# Patient Record
Sex: Female | Born: 1985 | Hispanic: No | Marital: Single | Smoking: Current every day smoker
Health system: Southern US, Community
[De-identification: ages and names within clinical notes are randomized; demographics above are authoritative.]

## PROBLEM LIST (undated history)

## (undated) ENCOUNTER — Inpatient Hospital Stay (HOSPITAL_COMMUNITY): Payer: Self-pay

## (undated) DIAGNOSIS — N390 Urinary tract infection, site not specified: Secondary | ICD-10-CM

## (undated) DIAGNOSIS — A599 Trichomoniasis, unspecified: Secondary | ICD-10-CM

## (undated) HISTORY — PX: INDUCED ABORTION: SHX677

---
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
## 2004-05-08 ENCOUNTER — Ambulatory Visit: Payer: Self-pay | Admitting: Obstetrics and Gynecology

## 2004-05-08 ENCOUNTER — Inpatient Hospital Stay (HOSPITAL_COMMUNITY): Admission: AD | Admit: 2004-05-08 | Discharge: 2004-05-11 | Payer: Self-pay | Admitting: *Deleted

## 2004-05-09 ENCOUNTER — Ambulatory Visit: Payer: Self-pay | Admitting: *Deleted

## 2004-12-17 ENCOUNTER — Emergency Department (HOSPITAL_COMMUNITY): Admission: EM | Admit: 2004-12-17 | Discharge: 2004-12-17 | Payer: Self-pay | Admitting: Family Medicine

## 2007-03-05 ENCOUNTER — Emergency Department (HOSPITAL_COMMUNITY): Admission: EM | Admit: 2007-03-05 | Discharge: 2007-03-05 | Payer: Self-pay | Admitting: Emergency Medicine

## 2007-11-13 ENCOUNTER — Ambulatory Visit: Payer: Self-pay | Admitting: Obstetrics and Gynecology

## 2007-11-13 ENCOUNTER — Inpatient Hospital Stay: Admission: RE | Admit: 2007-11-13 | Discharge: 2007-11-13 | Payer: Self-pay | Admitting: Obstetrics & Gynecology

## 2007-11-15 ENCOUNTER — Ambulatory Visit: Payer: Self-pay | Admitting: Advanced Practice Midwife

## 2007-11-15 ENCOUNTER — Inpatient Hospital Stay (HOSPITAL_COMMUNITY): Admission: AD | Admit: 2007-11-15 | Discharge: 2007-11-17 | Payer: Self-pay | Admitting: Obstetrics & Gynecology

## 2010-05-22 NOTE — H&P (Signed)
   [name], [name] NO.:  [id number]   MEDICAL RECORD NO.:  [id number]                   PATIENT TYPE:  INP   LOCATION:  9327                                 FACILITY:  [facility]   PHYSICIAN:  Kathreen Cosier, M.D.           DATE OF BIRTH:  19-Oct-1985   DATE OF ADMISSION:  11/04/2001  DATE OF DISCHARGE:                                HISTORY & PHYSICAL   HISTORY OF PRESENT ILLNESS:  The patient is an 25 year old gravida 2, para 0-  0-1-0, [name] November 25, 2001.  She had limited prenatal care.  Saw me twice  in the office.  She was admitted in early labor.  The cervix was 100-200%,  vertex -1.  She came in with a lot of bleeding, contracting every two  minutes.  The uterus was nice and soft in between contractions.  GBS was  unknown.  She was thought to be in early labor and admitted.  The patient  desired an epidural and after she received the epidural, her bleeding had  ceased.  On examination, she was 1-2 cm, 100% , vertex -1.  There was a  large amount of bright red blood present.  The uterus is still nice and soft  in between contractions.  It was decided that she was having a possible  early abruption and should be delivered by a C-section.   PHYSICAL EXAMINATION:  GENERAL:  Well-developed female in labor.  HEENT:  Negative.  Normal sclerae.  HEART:  Regular rhythm.  No murmurs, no gallops.  ABDOMEN:  Term-sized uterus.  EXTREMITIES:  Negative.   LABORATORY DATA:  On admission, her hemoglobin was 11.6, hematocrit 33,  platelets 254, urine negative, glucose 124.  Hepatitis negative.  Rubella-  immune.  Antibody screen negative.  She was O positive.                                               Kathreen Cosier, M.D.    BAM/MEDQ  D:  11/05/2001  T:  11/05/2001  Job:  [id number]

## 2010-05-22 NOTE — Op Note (Signed)
   NAMESEERAT, PEADEN NO.:  [id number]   MEDICAL RECORD NO.:  [id number]                   PATIENT TYPE:  INP   LOCATION:  9327                                 [facility]   PHYSICIAN:  Kathreen Cosier, M.D.           DATE OF BIRTH:  Feb 17, 1985   DATE OF PROCEDURE:  11/04/2001  DATE OF DISCHARGE:                                 OPERATIVE REPORT   PREOPERATIVE DIAGNOSIS:  Possible abruption of the placenta at 37 weeks.   POSTOPERATIVE DIAGNOSES:  Possible abruption of the placenta at 37 weeks.   PROCEDURE:   SURGEON:  Kathreen Cosier, M.D.   ANESTHESIA:  Epidural.   DESCRIPTION OF PROCEDURE:  The patient was placed on the operating table in  the supine position.  Abdomen prepped and draped.  Bladder emptied with a  Foley catheter.  Transverse suprapubic incision is made and carried down to  the fascia.  The fascia was cleaned and incised the length of the incision.  Recti muscles were retracted laterally.  Peritoneum incised longitudinally.  A transverse incision was then made in the visceral peritoneum above the  bladder.  The bladder was mobilized inferiorly.  A transverse lower incision  was made.  The fluid was clear.  The patient was delivered from the OP  position of a female, Apgars 6 and 8, weighing 5 pounds, 7 ounces.  There was  a moderate amount of free blood in the peritoneal cavity.  The placenta was  anterior and removed manually, and sent to pathology.  The uterine cavity  was cleaned with dry laps.  The uterine incision was closed in one layer  with continuous suture of #1 chromic.  Hemostasis was satisfactory.  The  bladder flap was reattached with 2-0 chromic.  The uterus was well  contracted.  Tubes and ovaries were normal.  The abdomen closed in layers,  peritoneum continuous suture of 0 chromic, fascia continuous with 2-0 Dexon,  and the skin closed with subcuticular stitch of 3-0 plain.  A pH was also  done at the  time of delivery.  The blood loss was 700 cc.                                               Kathreen Cosier, M.D.    BAM/MEDQ  D:  11/05/2001  T:  11/05/2001  Job:  [id number]

## 2010-05-22 NOTE — Discharge Summary (Signed)
   NAMEORETHA, WEISMANN NO.:  [id number]   MEDICAL RECORD NO.:  [id number]                   PATIENT TYPE:  INP   LOCATION:  9108                                 [facility]   PHYSICIAN:  Kathreen Cosier, M.D.           DATE OF BIRTH:  [birth year]   DATE OF ADMISSION:  11/04/2001  DATE OF DISCHARGE:                                 DISCHARGE SUMMARY   HOSPITAL COURSE:  The patient is an 25 year old gravida 2, para 0-0-1-0 [facility]  November 25, 2001 who was admitted in early labor, cervix 1 cm, 100%, vertex  minus 1, who came in because of bleeding.  She was contracting every two  minutes with uterus relaxed in between contractions and the contractions  being mild.  After initial exam, bleeding ceased.  The patient was  uncomfortable, got an epidural and the bleeding was noted to be heavy.  It  was decided this patient was having early abruption.  Was taken to the  operating room and had a female, Apgars of 6 and 8 from the OP position, with  a lot of free blood in the peritoneal cavity.  The placenta was sent to  pathology.  Cord pH was 7.35.  Blood loss was 700 cc.  The patient did well  postoperatively.  She had a female, Apgar of 5 and 8, weighing 5 pounds 7  ounces.  Postop, her hemoglobin was 8.4.  She did well and was discharged  home on the third postoperative day.   DISCHARGE DIAGNOSIS:  Status post abruption of placenta at 37 weeks.                                               Kathreen Cosier, M.D.    BAM/MEDQ  D:  11/07/2001  T:  11/07/2001  Job:  [id number]

## 2010-10-06 LAB — HEMOGLOBINOPATHY EVALUATION
Hgb A2 Quant: 2.4
Hgb A: 97.6 %
Hgb S Quant: 0 % (ref 0.0–0.0)

## 2010-10-06 LAB — DIFFERENTIAL
Eosinophils Absolute: 0.1
Eosinophils Relative: 1
Lymphs Abs: 1.6
Monocytes Absolute: 0.6
Monocytes Relative: 7

## 2010-10-06 LAB — URINALYSIS, ROUTINE W REFLEX MICROSCOPIC
Bilirubin Urine: NEGATIVE
Glucose, UA: NEGATIVE
Ketones, ur: NEGATIVE
Nitrite: NEGATIVE
Protein, ur: NEGATIVE
Specific Gravity, Urine: 1.015
Urobilinogen, UA: 0.2
pH: 6.5

## 2010-10-06 LAB — LUPUS ANTICOAGULANT PANEL: Lupus Anticoagulant: NOT DETECTED

## 2010-10-06 LAB — STREP B DNA PROBE

## 2010-10-06 LAB — SICKLE CELL SCREEN: Sickle Cell Screen: NEGATIVE

## 2010-10-06 LAB — RPR: RPR Ser Ql: NONREACTIVE

## 2010-10-06 LAB — URINE MICROSCOPIC-ADD ON

## 2010-10-06 LAB — GLUCOSE TOLERANCE, 1 HOUR: Glucose, 1 Hour GTT: 82

## 2010-10-06 LAB — URINE CULTURE

## 2010-10-06 LAB — TYPE AND SCREEN
ABO/RH(D): O POS
Antibody Screen: NEGATIVE

## 2010-10-06 LAB — CBC
HCT: 32.2 — ABNORMAL LOW
Hemoglobin: 10.8 — ABNORMAL LOW
MCV: 93.5
Platelets: 304
RBC: 3.53 — ABNORMAL LOW
RDW: 15.6 — ABNORMAL HIGH
WBC: 8.8

## 2010-10-06 LAB — CARDIOLIPIN ANTIBODIES, IGM+IGG
Anticardiolipin IgG: <7 — ABNORMAL LOW (ref <11)
Anticardiolipin IgM: 10 — ABNORMAL LOW (ref <10)

## 2010-10-06 LAB — ABO/RH: ABO/RH(D): O POS

## 2010-10-06 LAB — RAPID URINE DRUG SCREEN, HOSP PERFORMED: Cocaine: NOT DETECTED

## 2010-10-06 LAB — RUBELLA SCREEN: Rubella: 99.3 — ABNORMAL HIGH

## 2011-01-05 NOTE — L&D Delivery Note (Signed)
I was present for delivery and repair and agree with above. Napoleon Form, MD

## 2011-01-05 NOTE — L&D Delivery Note (Signed)
Delivery Note At 10:28 AM a viable and healthy female was delivered via VBAC, Spontaneous (Presentation: Right Occiput Anterior).  APGAR: 9, 9; weight pending.   Placenta status: in tact, .  Cord: 3 vessels with the following complications: None.    Anesthesia: Epidural  Episiotomy: None Lacerations: None Suture Repair: n/a Est. Blood Loss (mL): 300  Mom to postpartum.  Baby to nursery-stable.  Simone Curia 09/20/2011, 11:21 AM

## 2011-04-22 ENCOUNTER — Emergency Department (HOSPITAL_COMMUNITY)
Admission: EM | Admit: 2011-04-22 | Discharge: 2011-04-22 | Disposition: A | Discharge status: Home / Self Care | Payer: Self-pay | Attending: Emergency Medicine | Admitting: Emergency Medicine

## 2011-04-22 ENCOUNTER — Other Ambulatory Visit (HOSPITAL_COMMUNITY): Payer: Self-pay

## 2011-04-22 ENCOUNTER — Emergency Department (HOSPITAL_COMMUNITY): Payer: Self-pay

## 2011-04-22 ENCOUNTER — Encounter (HOSPITAL_COMMUNITY): Payer: Self-pay | Admitting: *Deleted

## 2011-04-22 DIAGNOSIS — A599 Trichomoniasis, unspecified: Secondary | ICD-10-CM

## 2011-04-22 DIAGNOSIS — O98819 Other maternal infectious and parasitic diseases complicating pregnancy, unspecified trimester: Secondary | ICD-10-CM | POA: Insufficient documentation

## 2011-04-22 DIAGNOSIS — N390 Urinary tract infection, site not specified: Secondary | ICD-10-CM | POA: Insufficient documentation

## 2011-04-22 DIAGNOSIS — O2390 Unspecified genitourinary tract infection in pregnancy, unspecified trimester: Secondary | ICD-10-CM | POA: Insufficient documentation

## 2011-04-22 DIAGNOSIS — A5909 Other urogenital trichomoniasis: Secondary | ICD-10-CM | POA: Insufficient documentation

## 2011-04-22 DIAGNOSIS — R109 Unspecified abdominal pain: Secondary | ICD-10-CM | POA: Insufficient documentation

## 2011-04-22 DIAGNOSIS — Z331 Pregnant state, incidental: Secondary | ICD-10-CM

## 2011-04-22 DIAGNOSIS — N72 Inflammatory disease of cervix uteri: Secondary | ICD-10-CM

## 2011-04-22 LAB — CBC
MCV: 90.4 fL (ref 78.0–100.0)
Platelets: 247 10*3/uL (ref 150–400)
RBC: 3.65 MIL/uL — ABNORMAL LOW (ref 3.87–5.11)
WBC: 9 10*3/uL (ref 4.0–10.5)

## 2011-04-22 LAB — URINE MICROSCOPIC-ADD ON

## 2011-04-22 LAB — URINALYSIS, ROUTINE W REFLEX MICROSCOPIC
Glucose, UA: NEGATIVE mg/dL
Specific Gravity, Urine: 1.029 (ref 1.005–1.030)
pH: 5.5 (ref 5.0–8.0)

## 2011-04-22 LAB — DIFFERENTIAL
Lymphocytes Relative: 21 % (ref 12–46)
Lymphs Abs: 1.9 10*3/uL (ref 0.7–4.0)
Neutro Abs: 6.4 10*3/uL (ref 1.7–7.7)
Neutrophils Relative %: 71 % (ref 43–77)

## 2011-04-22 LAB — POCT I-STAT, CHEM 8
BUN: 7 mg/dL (ref 6–23)
Calcium, Ion: 1.24 mmol/L (ref 1.12–1.32)
Chloride: 106 mEq/L (ref 96–112)
Glucose, Bld: 88 mg/dL (ref 70–99)
Potassium: 3.5 mEq/L (ref 3.5–5.1)

## 2011-04-22 LAB — PREGNANCY, URINE: Preg Test, Ur: POSITIVE — AB

## 2011-04-22 LAB — POCT PREGNANCY, URINE: Preg Test, Ur: POSITIVE — AB

## 2011-04-22 MED ORDER — NITROFURANTOIN MONOHYD MACRO 100 MG PO CAPS: 100.0000 mg | ORAL_CAPSULE | Freq: Two times a day (BID) | ORAL | Status: AC

## 2011-04-22 MED ORDER — AZITHROMYCIN 250 MG PO TABS
1000.0000 mg | ORAL_TABLET | Freq: Once | ORAL | Status: AC
  Administered 2011-04-22: 1000 mg via ORAL
  Filled 2011-04-22: qty 4

## 2011-04-22 MED ORDER — CEFTRIAXONE SODIUM 250 MG IJ SOLR
250.0000 mg | Freq: Once | INTRAMUSCULAR | Status: AC
  Administered 2011-04-22: 250 mg via INTRAMUSCULAR
  Filled 2011-04-22: qty 250

## 2011-04-22 MED ORDER — PRENATAL COMPLETE 14-0.4 MG PO TABS: 1.0000 | ORAL_TABLET | Freq: Every day | ORAL | Status: DC

## 2011-04-22 MED ORDER — LIDOCAINE HCL (PF) 1 % IJ SOLN
INTRAMUSCULAR | Status: AC
  Administered 2011-04-22: 5 mL
  Filled 2011-04-22: qty 5

## 2011-04-22 MED ORDER — METRONIDAZOLE 500 MG PO TABS: ORAL_TABLET | ORAL | Status: DC

## 2011-04-22 NOTE — Discharge Instructions (Signed)
Take macrobid as prescribed until all gone for urinary tract infection. Take prenatal multi vitamins daily. Tylenol for cramping. Your pregnancy estimated at 8w 6d. Make sure your partner treated for infection as well. Take flagyl as prescribed once you are in 2nd trimester for trichomonas, this medication is not safe right now this early in pregnancy. Follow up with your GYN doctor for further prenatal care and recheck.  ABCs of Pregnancy A Antepartum care is very important. Be sure you see your doctor and get prenatal care as soon as you think you are pregnant. At this time, you will be tested for infection, genetic abnormalities and potential problems with you and the pregnancy. This is the time to discuss diet, exercise, work, medications, labor, pain medication during labor and the possibility of a cesarean delivery. Ask any questions that may concern you. It is important to see your doctor regularly throughout your pregnancy. Avoid exposure to toxic substances and chemicals - such as cleaning solvents, lead and mercury, some insecticides, and paint. Pregnant women should avoid exposure to paint fumes, and fumes that cause you to feel ill, dizzy or faint. When possible, it is a good idea to have a pre-pregnancy consultation with your caregiver to begin some important recommendations your caregiver suggests such as, taking folic acid, exercising, quitting smoking, avoiding alcoholic beverages, etc. B Breastfeeding is the healthiest choice for both you and your baby. It has many nutritional benefits for the baby and health benefits for the mother. It also creates a very tight and loving bond between the baby and mother. Talk to your doctor, your family and friends, and your employer about how you choose to feed your baby and how they can support you in your decision. Not all birth defects can be prevented, but a woman can take actions that may increase her chance of having a healthy baby. Many birth defects  happen very early in pregnancy, sometimes before a woman even knows she is pregnant. Birth defects or abnormalities of any child in your or the father's family should be discussed with your caregiver. Get a good support bra as your breast size changes. Wear it especially when you exercise and when nursing.  C Celebrate the news of your pregnancy with the your spouse/father and family. Childbirth classes are helpful to take for you and the spouse/father because it helps to understand what happens during the pregnancy, labor and delivery. Cesarean delivery should be discussed with your doctor so you are prepared for that possibility. The pros and cons of circumcision if it is a boy, should be discussed with your pediatrician. Cigarette smoking during pregnancy can result in low birth weight babies. It has been associated with infertility, miscarriages, tubal pregnancies, infant death (mortality) and poor health (morbidity) in childhood. Additionally, cigarette smoking may cause long-term learning disabilities. If you smoke, you should try to quit before getting pregnant and not smoke during the pregnancy. Secondary smoke may also harm a mother and her developing baby. It is a good idea to ask people to stop smoking around you during your pregnancy and after the baby is born. Extra calcium is necessary when you are pregnant and is found in your prenatal vitamin, in dairy products, green leafy vegetables and in calcium supplements. D A healthy diet according to your current weight and height, along with vitamins and mineral supplements should be discussed with your caregiver. Domestic abuse or violence should be made known to your doctor right away to get the situation corrected. Drink  more water when you exercise to keep hydrated. Discomfort of your back and legs usually develops and progresses from the middle of the second trimester through to delivery of the baby. This is because of the enlarging baby and uterus,  which may also affect your balance. Do not take illegal drugs. Illegal drugs can seriously harm the baby and you. Drink extra fluids (water is best) throughout pregnancy to help your body keep up with the increases in your blood volume. Drink at least 6 to 8 glasses of water, fruit juice, or milk each day. A good way to know you are drinking enough fluid is when your urine looks almost like clear water or is very light yellow.  E Eat healthy to get the nutrients you and your unborn baby need. Your meals should include the five basic food groups. Exercise (30 minutes of light to moderate exercise a day) is important and encouraged during pregnancy, if there are no medical problems or problems with the pregnancy. Exercise that causes discomfort or dizziness should be stopped and reported to your caregiver. Emotions during pregnancy can change from being ecstatic to depression and should be understood by you, your partner and your family. F Fetal screening with ultrasound, amniocentesis and monitoring during pregnancy and labor is common and sometimes necessary. Take 400 micrograms of folic acid daily both before, when possible, and during the first few months of pregnancy to reduce the risk of birth defects of the brain and spine. All women who could possibly become pregnant should take a vitamin with folic acid, every day. It is also important to eat a healthy diet with fortified foods (enriched grain products, including cereals, rice, breads, and pastas) and foods with natural sources of folate (orange juice, green leafy vegetables, beans, peanuts, broccoli, asparagus, peas, and lentils). The father should be involved with all aspects of the pregnancy including, the prenatal care, childbirth classes, labor, delivery, and postpartum time. Fathers may also have emotional concerns about being a father, financial needs, and raising a family. G Genetic testing should be done appropriately. It is important to know  your family and the father's history. If there have been problems with pregnancies or birth defects in your family, report these to your doctor. Also, genetic counselors can talk with you about the information you might need in making decisions about having a family. You can call a major medical center in your area for help in finding a board-certified genetic counselor. Genetic testing and counseling should be done before pregnancy when possible, especially if there is a history of problems in the mother's or father's family. Certain ethnic backgrounds are more at risk for genetic defects. H Get familiar with the hospital where you will be having your baby. Get to know how long it takes to get there, the labor and delivery area, and the hospital procedures. Be sure your medical insurance is accepted there. Get your home ready for the baby including, clothes, the baby's room (when possible), furniture and car seat. Hand washing is important throughout the day, especially after handling raw meat and poultry, changing the baby's diaper or using the bathroom. This can help prevent the spread of many bacteria and viruses that cause infection. Your hair may become dry and thinner, but will return to normal a few weeks after the baby is born. Heartburn is a common problem that can be treated by taking antacids recommended by your caregiver, eating smaller meals 5 or 6 times a day, not drinking liquids  when eating, drinking between meals and raising the head of your bed 2 to 3 inches. I Insurance to cover you, the baby, doctor and hospital should be reviewed so that you will be prepared to pay any costs not covered by your insurance plan. If you do not have medical insurance, there are usually clinics and services available for you in your community. Take 30 milligrams of iron during your pregnancy as prescribed by your doctor to reduce the risk of low red blood cells (anemia) later in pregnancy. All women of  childbearing age should eat a diet rich in iron. J There should be a joint effort for the mother, father and any other children to adapt to the pregnancy financially, emotionally, and psychologically during the pregnancy. Join a support group for moms-to-be. Or, join a class on parenting or childbirth. Have the family participate when possible. K Know your limits. Let your caregiver know if you experience any of the following:   Pain of any kind.   Strong cramps.   You develop a lot of weight in a short period of time (5 pounds in 3 to 5 days).   Vaginal bleeding, leaking of amniotic fluid.   Headache, vision problems.   Dizziness, fainting, shortness of breath.   Chest pain.   Fever of 102 F (38.9 C) or higher.   Gush of clear fluid from your vagina.   Painful urination.   Domestic violence.   Irregular heartbeat (palpitations).   Rapid beating of the heart (tachycardia).   Constant feeling sick to your stomach (nauseous) and vomiting.   Trouble walking, fluid retention (edema).   Muscle weakness.   If your baby has decreased activity.   Persistent diarrhea.   Abnormal vaginal discharge.   Uterine contractions at 20-minute intervals.   Back pain that travels down your leg.  L Learn and practice that what you eat and drink should be in moderation and healthy for you and your baby. Legal drugs such as alcohol and caffeine are important issues for pregnant women. There is no safe amount of alcohol a woman can drink while pregnant. Fetal alcohol syndrome, a disorder characterized by growth retardation, facial abnormalities, and central nervous system dysfunction, is caused by a woman's use of alcohol during pregnancy. Caffeine, found in tea, coffee, soft drinks and chocolate, should also be limited. Be sure to read labels when trying to cut down on caffeine during pregnancy. More than 200 foods, beverages, and over-the-counter medications contain caffeine and have a  high salt content! There are coffees and teas that do not contain caffeine. M Medical conditions such as diabetes, epilepsy, and high blood pressure should be treated and kept under control before pregnancy when possible, but especially during pregnancy. Ask your caregiver about any medications that may need to be changed or adjusted during pregnancy. If you are currently taking any medications, ask your caregiver if it is safe to take them while you are pregnant or before getting pregnant when possible. Also, be sure to discuss any herbs or vitamins you are taking. They are medicines, too! Discuss with your doctor all medications, prescribed and over-the-counter, that you are taking. During your prenatal visit, discuss the medications your doctor may give you during labor and delivery. N Never be afraid to ask your doctor or caregiver questions about your health, the progress of the pregnancy, family problems, stressful situations, and recommendation for a pediatrician, if you do not have one. It is better to take all precautions and discuss  any questions or concerns you may have during your office visits. It is a good idea to write down your questions before you visit the doctor. O Over-the-counter cough and cold remedies may contain alcohol or other ingredients that should be avoided during pregnancy. Ask your caregiver about prescription, herbs or over-the-counter medications that you are taking or may consider taking while pregnant.  P Physical activity during pregnancy can benefit both you and your baby by lessening discomfort and fatigue, providing a sense of well-being, and increasing the likelihood of early recovery after delivery. Light to moderate exercise during pregnancy strengthens the belly (abdominal) and back muscles. This helps improve posture. Practicing yoga, walking, swimming, and cycling on a stationary bicycle are usually safe exercises for pregnant women. Avoid scuba diving, exercise  at high altitudes (over 3000 feet), skiing, horseback riding, contact sports, etc. Always check with your doctor before beginning any kind of exercise, especially during pregnancy and especially if you did not exercise before getting pregnant. Q Queasiness, stomach upset and morning sickness are common during pregnancy. Eating a couple of crackers or dry toast before getting out of bed. Foods that you normally love may make you feel sick to your stomach. You may need to substitute other nutritious foods. Eating 5 or 6 small meals a day instead of 3 large ones may make you feel better. Do not drink with your meals, drink between meals. Questions that you have should be written down and asked during your prenatal visits. R Read about and make plans to baby-proof your home. There are important tips for making your home a safer environment for your baby. Review the tips and make your home safer for you and your baby. Read food labels regarding calories, salt and fat content in the food. S Saunas, hot tubs, and steam rooms should be avoided while you are pregnant. Excessive high heat may be harmful during your pregnancy. Your caregiver will screen and examine you for sexually transmitted diseases and genetic disorders during your prenatal visits. Learn the signs of labor. Sexual relations while pregnant is safe unless there is a medical or pregnancy problem and your caregiver advises against it. T Traveling long distances should be avoided especially in the third trimester of your pregnancy. If you do have to travel out of state, be sure to take a copy of your medical records and medical insurance plan with you. You should not travel long distances without seeing your doctor first. Most airlines will not allow you to travel after 36 weeks of pregnancy. Toxoplasmosis is an infection caused by a parasite that can seriously harm an unborn baby. Avoid eating undercooked meat and handling cat litter. Be sure to wear  gloves when gardening. Tingling of the hands and fingers is not unusual and is due to fluid retention. This will go away after the baby is born. U Womb (uterus) size increases during the first trimester. Your kidneys will begin to function more efficiently. This may cause you to feel the need to urinate more often. You may also leak urine when sneezing, coughing or laughing. This is due to the growing uterus pressing against your bladder, which lies directly in front of and slightly under the uterus during the first few months of pregnancy. If you experience burning along with frequency of urination or bloody urine, be sure to tell your doctor. The size of your uterus in the third trimester may cause a problem with your balance. It is advisable to maintain good posture and  avoid wearing high heels during this time. An ultrasound of your baby may be necessary during your pregnancy and is safe for you and your baby. V Vaccinations are an important concern for pregnant women. Get needed vaccines before pregnancy. Center for Disease Control ([employer]) has clear guidelines for the use of vaccines during pregnancy. Review the list, be sure to discuss it with your doctor. Prenatal vitamins are helpful and healthy for you and the baby. Do not take extra vitamins except what is recommended. Taking too much of certain vitamins can cause overdose problems. Continuous vomiting should be reported to your caregiver. Varicose veins may appear especially if there is a family history of varicose veins. They should subside after the delivery of the baby. Support hose helps if there is leg discomfort. W Being overweight or underweight during pregnancy may cause problems. Try to get within 15 pounds of your ideal weight before pregnancy. Remember, pregnancy is not a time to be dieting! Do not stop eating or start skipping meals as your weight increases. Both you and your baby need the calories and nutrition you receive from a  healthy diet. Be sure to consult with your doctor about your diet. There is a formula and diet plan available depending on whether you are overweight or underweight. Your caregiver or nutritionist can help and advise you if necessary. X Avoid X-rays. If you must have dental work or diagnostic tests, tell your dentist or physician that you are pregnant so that extra care can be taken. X-rays should only be taken when the risks of not taking them outweigh the risk of taking them. If needed, only the minimum amount of radiation should be used. When X-rays are necessary, protective lead shields should be used to cover areas of the body that are not being X-rayed. Y Your baby loves you. Breastfeeding your baby creates a loving and very close bond between the two of you. Give your baby a healthy environment to live in while you are pregnant. Infants and children require constant care and guidance. Their health and safety should be carefully watched at all times. After the baby is born, rest or take a nap when the baby is sleeping. Z Get your ZZZs. Be sure to get plenty of rest. Resting on your side as often as possible, especially on your left side is advised. It provides the best circulation to your baby and helps reduce swelling. Try taking a nap for 30 to 45 minutes in the afternoon when possible. After the baby is born rest or take a nap when the baby is sleeping. Try elevating your feet for that amount of time when possible. It helps the circulation in your legs and helps reduce swelling.  Most information courtesy of the CDC. Document Released: 12/21/2004 Document Revised: 12/10/2010 Document Reviewed: 09/04/2008 [facility] Patient Information 2012 [place], [place].

## 2011-04-22 NOTE — ED Provider Notes (Signed)
History     CSN: [id number]  Arrival date & time 04/22/11  1348   First MD Initiated Contact with Patient 04/22/11 1359      Chief Complaint  Patient presents with  . Abdominal Pain    (Consider location/radiation/quality/duration/timing/severity/associated sxs/prior treatment) Patient is a 26 y.o. female presenting with abdominal pain. The history is provided by the patient.  Abdominal Pain The primary symptoms of the illness include abdominal pain and vaginal discharge. The primary symptoms of the illness do not include fatigue, shortness of breath, nausea, vomiting, hematemesis, dysuria or vaginal bleeding. The current episode started 3 to 5 hours ago. The onset of the illness was sudden. The problem has not changed since onset. The vaginal discharge is not associated with dysuria.   Symptoms associated with the illness do not include urgency.  pt states she may be pregnant. States last normal period was in January, does not recall the date. Pain mainly in left lower abdomen. Did not take any medications for this. Movement an palpation making it worse, staying still makes it better.  History reviewed. No pertinent past medical history.  History reviewed. No pertinent past surgical history.  History reviewed. No pertinent family history.  History  Substance Use Topics  . Smoking status: Not on file  . Smokeless tobacco: Not on file  . Alcohol Use: No    OB History    Grav Para Term Preterm Abortions TAB SAB Ect Mult Living   1               Review of Systems  Constitutional: Negative for fatigue.  HENT: Negative.   Respiratory: Negative for shortness of breath.   Cardiovascular: Negative.   Gastrointestinal: Positive for abdominal pain. Negative for nausea, vomiting, blood in stool and hematemesis.  Genitourinary: Positive for vaginal discharge. Negative for dysuria, urgency, flank pain and vaginal bleeding.  Musculoskeletal: Negative.   Skin: Negative.     Neurological: Negative.   Psychiatric/Behavioral: Negative.     Allergies  Review of patient's allergies indicates no known allergies.  Home Medications   Current Outpatient Rx  Name Route Sig Dispense Refill  . ACETAMINOPHEN 500 MG PO TABS Oral Take 500 mg by mouth once as needed. As needed for headache.      BP 104/64  Pulse 94  Temp(Src) 99 F (37.2 C) (Oral)  Resp 18  SpO2 98%  Physical Exam  Nursing note and vitals reviewed. Constitutional: She is oriented to person, place, and time. She appears well-developed and well-nourished. No distress.  HENT:  Head: Normocephalic and atraumatic.  Eyes: Conjunctivae are normal.  Neck: Neck supple.  Cardiovascular: Normal rate, regular rhythm and normal heart sounds.   Pulmonary/Chest: Effort normal and breath sounds normal. No respiratory distress. She has no wheezes.  Abdominal: Soft. Bowel sounds are normal.  Genitourinary:       Normal external genitalia. White vaginal discharge, cervix normal. CMT, left adnexal tenderness.   Musculoskeletal: Normal range of motion.  Neurological: She is alert and oriented to person, place, and time.  Skin: Skin is warm and dry.  Psychiatric: She has a normal mood and affect.    ED Course  Procedures (including critical care time)  Pt with LLQ pain, positive pregn test. Will get [place] to r/o ectopic pregnancy.   Results for orders placed during the hospital encounter of 04/22/11  CBC      Component Value Range   WBC 9.0  4.0 - 10.5 (K/uL)   RBC 3.65 (*)  3.87 - 5.11 (MIL/uL)   Hemoglobin 11.9 (*) 12.0 - 15.0 (g/dL)   HCT 57.8 (*) 46.9 - 46.0 (%)   MCV 90.4  78.0 - 100.0 (fL)   MCH 32.6  26.0 - 34.0 (pg)   MCHC 36.1 (*) 30.0 - 36.0 (g/dL)   RDW 62.9  52.8 - 41.3 (%)   Platelets 247  150 - 400 (K/uL)  DIFFERENTIAL      Component Value Range   Neutrophils Relative 71  43 - 77 (%)   Neutro Abs 6.4  1.7 - 7.7 (K/uL)   Lymphocytes Relative 21  12 - 46 (%)   Lymphs Abs 1.9  0.7 - 4.0  (K/uL)   Monocytes Relative 7  3 - 12 (%)   Monocytes Absolute 0.6  0.1 - 1.0 (K/uL)   Eosinophils Relative 1  0 - 5 (%)   Eosinophils Absolute 0.1  0.0 - 0.7 (K/uL)   Basophils Relative 0  0 - 1 (%)   Basophils Absolute 0.0  0.0 - 0.1 (K/uL)  URINALYSIS, ROUTINE W REFLEX MICROSCOPIC      Component Value Range   Color, Urine YELLOW  YELLOW    APPearance CLOUDY (*) CLEAR    Specific Gravity, Urine 1.029  1.005 - 1.030    pH 5.5  5.0 - 8.0    Glucose, UA NEGATIVE  NEGATIVE (mg/dL)   Hgb urine dipstick NEGATIVE  NEGATIVE    Bilirubin Urine NEGATIVE  NEGATIVE    Ketones, ur 15 (*) NEGATIVE (mg/dL)   Protein, ur 30 (*) NEGATIVE (mg/dL)   Urobilinogen, UA 1.0  0.0 - 1.0 (mg/dL)   Nitrite NEGATIVE  NEGATIVE    Leukocytes, UA MODERATE (*) NEGATIVE   PREGNANCY, URINE      Component Value Range   Preg Test, Ur POSITIVE (*) NEGATIVE   HCG, QUANTITATIVE, PREGNANCY      Component Value Range   hCG, Beta Chain, Quant, S 15574 (*) <5 (mIU/mL)  WET PREP, GENITAL      Component Value Range   Yeast Wet Prep HPF POC NONE SEEN  NONE SEEN    Trich, Wet Prep FEW (*) NONE SEEN    Clue Cells Wet Prep HPF POC FEW (*) NONE SEEN    WBC, Wet Prep HPF POC MODERATE (*) NONE SEEN   POCT I-STAT, CHEM 8      Component Value Range   Sodium 140  135 - 145 (mEq/L)   Potassium 3.5  3.5 - 5.1 (mEq/L)   Chloride 106  96 - 112 (mEq/L)   BUN 7  6 - 23 (mg/dL)   Creatinine, Ser 2.44  0.50 - 1.10 (mg/dL)   Glucose, Bld 88  70 - 99 (mg/dL)   Calcium, Ion 0.10  2.72 - 1.32 (mmol/L)   TCO2 22  0 - 100 (mmol/L)   Hemoglobin 11.2 (*) 12.0 - 15.0 (g/dL)   HCT 53.6 (*) 64.4 - 46.0 (%)  POCT PREGNANCY, URINE      Component Value Range   Preg Test, Ur POSITIVE (*) NEGATIVE   URINE MICROSCOPIC-ADD ON      Component Value Range   Squamous Epithelial / LPF MANY (*) RARE    WBC, UA TOO NUMEROUS TO COUNT  <3 (WBC/hpf)   Bacteria, UA FEW (*) RARE    Urine-Other TRICHOMONAS PRESENT     4:15 PM [place] pending. Going to  treat for cervicitis with rocephin and zithromax here in ED. Will treat UTI with macrobid, cultures sent. Pt non toxic.  Afebrile. Doubt PID or TOA. Will hold off on treating trichomonas until 2nd trimester. Results discussed with pt.   Pt signed out with PA Paz, if [place] normal, d/c home with outpatient follow up. This was discussed with pt.   1. Pregnancy as incidental finding   2. Urinary tract infection   3. Cervicitis   4. Trichimoniasis       MDM          Lottie Mussel, PA 04/23/11 [phone]

## 2011-04-22 NOTE — ED Notes (Signed)
Reports having LLQ pain, reports being pregnant but unsure of how many weeks or when her last period was. Reports mild vaginal bleeding 1-2 weeks ago.

## 2011-04-23 LAB — GC/CHLAMYDIA PROBE AMP, GENITAL
Chlamydia, DNA Probe: NEGATIVE
GC Probe Amp, Genital: NEGATIVE

## 2011-04-23 NOTE — ED Provider Notes (Signed)
Medical screening examination/treatment/procedure(s) were performed by non-physician practitioner and as supervising physician I was immediately available for consultation/collaboration.    Umar Patmon R Ashleen Demma, MD 04/23/11 1625 

## 2011-07-07 ENCOUNTER — Inpatient Hospital Stay (HOSPITAL_COMMUNITY)
Admission: AD | Admit: 2011-07-07 | Discharge: 2011-07-07 | Disposition: A | Discharge status: Home / Self Care | Payer: Medicaid Other | Source: Ambulatory Visit | Attending: Obstetrics & Gynecology | Admitting: Obstetrics & Gynecology

## 2011-07-07 ENCOUNTER — Encounter (HOSPITAL_COMMUNITY): Payer: Self-pay | Admitting: *Deleted

## 2011-07-07 DIAGNOSIS — O479 False labor, unspecified: Secondary | ICD-10-CM

## 2011-07-07 DIAGNOSIS — R109 Unspecified abdominal pain: Secondary | ICD-10-CM | POA: Insufficient documentation

## 2011-07-07 DIAGNOSIS — O0930 Supervision of pregnancy with insufficient antenatal care, unspecified trimester: Secondary | ICD-10-CM | POA: Insufficient documentation

## 2011-07-07 DIAGNOSIS — O4700 False labor before 37 completed weeks of gestation, unspecified trimester: Secondary | ICD-10-CM | POA: Insufficient documentation

## 2011-07-07 DIAGNOSIS — O0990 Supervision of high risk pregnancy, unspecified, unspecified trimester: Secondary | ICD-10-CM

## 2011-07-07 HISTORY — DX: Trichomoniasis, unspecified: A59.9

## 2011-07-07 HISTORY — DX: Urinary tract infection, site not specified: N39.0

## 2011-07-07 LAB — URINALYSIS, ROUTINE W REFLEX MICROSCOPIC
Bilirubin Urine: NEGATIVE
Nitrite: NEGATIVE
Protein, ur: NEGATIVE mg/dL
Specific Gravity, Urine: 1.025 (ref 1.005–1.030)
Urobilinogen, UA: 0.2 mg/dL (ref 0.0–1.0)

## 2011-07-07 LAB — URINE MICROSCOPIC-ADD ON

## 2011-07-07 LAB — WET PREP, GENITAL: Trich, Wet Prep: NONE SEEN

## 2011-07-07 MED ORDER — METRONIDAZOLE 500 MG PO TABS
2000.0000 mg | ORAL_TABLET | Freq: Once | ORAL | Status: AC
  Administered 2011-07-07: 500 mg via ORAL
  Filled 2011-07-07: qty 4

## 2011-07-07 MED ORDER — METRONIDAZOLE 500 MG PO TABS: 1000.0000 mg | ORAL_TABLET | Freq: Once | ORAL | Status: DC

## 2011-07-07 NOTE — MAU Note (Signed)
Flagyl 500mg   x4 tablets given.  Discussed partner treatment.  Had been treated, she was not, they have continued to have intercourse.

## 2011-07-07 NOTE — MAU Provider Note (Signed)
History     CSN: [id number]  Arrival date and time: 07/07/11 1557   First Provider Initiated Contact with Patient 07/07/11 1700      Chief Complaint  Patient presents with  . Abdominal Pain   HPI  Pt is here with report of abdominal pain that started early today.  Pain is intermittent and "feels like contractions".  Denies vaginal bleeding or leaking of fluid.  Recently diagnosed with trich, unable to pay for prescriptions.  Has not started prenatal care, denies Medicaid x 3 due to prior employer not submitting letter indicating she no longer works there.    Past Medical History  Diagnosis Date  . Urinary tract infection   . Preterm labor   . Trichomonas     Past Surgical History  Procedure Date  . Cesarean section   . Induced abortion   . No past surgeries     Family History  Problem Relation Age of Onset  . Other Neg Hx     History  Substance Use Topics  . Smoking status: Current Everyday Smoker -- 0.2 packs/day for 7 years    Types: Cigarettes  . Smokeless tobacco: Never Used  . Alcohol Use: No    Allergies: No Known Allergies  Prescriptions prior to admission  Medication Sig Dispense Refill  . acetaminophen (TYLENOL) 500 MG tablet Take 500 mg by mouth once as needed. As needed for headache.      . Prenatal Vit-Fe Fumarate-FA (PRENATAL COMPLETE) 14-0.4 MG TABS Take 1 tablet by mouth daily.  60 each  0  . metroNIDAZOLE (FLAGYL) 500 MG tablet Take all 4 tabs at once in 4 weeks.  4 tablet  0    Review of Systems  Gastrointestinal: Positive for abdominal pain (contractions).  All other systems reviewed and are negative.   Physical Exam   Blood pressure 110/71, pulse 95, temperature 97.8 F (36.6 C), temperature source Oral, resp. rate 20, weight 86.183 kg (190 lb).  Physical Exam  Constitutional: She is oriented to person, place, and time. She appears well-developed and well-nourished. No distress.  HENT:  Head: Normocephalic.  Neck: Normal range of  motion. Neck supple.  Cardiovascular: Normal rate and regular rhythm.   Respiratory: Effort normal and breath sounds normal.  GI: Soft. There is no tenderness.       Fundal Height - 27 cm  Genitourinary: Cervix exhibits motion tenderness. Vaginal discharge (white, creamy; frothy) found.       +fishy odor Cervix - closed  Neurological: She is alert and oriented to person, place, and time.  Skin: Skin is warm and dry.    MAU Course  Procedures  Flagyl 2 GM PO  Results for orders placed during the hospital encounter of 07/07/11 (from the past 24 hour(s))  URINALYSIS, ROUTINE W REFLEX MICROSCOPIC     Status: Abnormal   Collection Time   07/07/11  4:30 PM      Component Value Range   Color, Urine YELLOW  YELLOW   APPearance HAZY (*) CLEAR   Specific Gravity, Urine 1.025  1.005 - 1.030   pH 6.0  5.0 - 8.0   Glucose, UA NEGATIVE  NEGATIVE mg/dL   Hgb urine dipstick NEGATIVE  NEGATIVE   Bilirubin Urine NEGATIVE  NEGATIVE   Ketones, ur NEGATIVE  NEGATIVE mg/dL   Protein, ur NEGATIVE  NEGATIVE mg/dL   Urobilinogen, UA 0.2  0.0 - 1.0 mg/dL   Nitrite NEGATIVE  NEGATIVE   Leukocytes, UA SMALL (*) NEGATIVE  URINE  MICROSCOPIC-ADD ON     Status: Abnormal   Collection Time   07/07/11  4:30 PM      Component Value Range   Squamous Epithelial / LPF FEW (*) RARE   WBC, UA 7-10  <3 WBC/hpf   RBC / HPF 3-6  <3 RBC/hpf   Bacteria, UA MANY (*) RARE   Urine-Other MUCOUS PRESENT    WET PREP, GENITAL     Status: Abnormal   Collection Time   07/07/11  5:18 PM      Component Value Range   Yeast Wet Prep HPF POC NONE SEEN  NONE SEEN   Trich, Wet Prep NONE SEEN  NONE SEEN   Clue Cells Wet Prep HPF POC FEW (*) NONE SEEN   WBC, Wet Prep HPF POC MODERATE (*) NONE SEEN  FETAL FIBRONECTIN     Status: Normal   Collection Time   07/07/11  5:18 PM      Component Value Range   Fetal Fibronectin NEGATIVE  NEGATIVE   FHR 140's, +accel, reactive Toco - irregular  Consulted with Dr. Emelda Fear regarding HPI/OB  Hx/Exam and negative fetal fibronectin>approves discharge home with follow-up next week.  Assessment and Plan  Braxton Hicks No Prenatal Care History of Preterm Delivery x 2  Plan: Outpatient Anatomy Ultrasound next week Schedule appt in [facility] (message sent to K. Rassete) Reviewed preterm labor precautions  [facility] 07/07/2011, 5:06 PM

## 2011-07-07 NOTE — MAU Note (Signed)
Pain started this morning.  Cramping in upper side of abd.  No prenatal care, has been denied x3 for medicaid.

## 2011-07-08 LAB — GC/CHLAMYDIA PROBE AMP, GENITAL
Chlamydia, DNA Probe: NEGATIVE
GC Probe Amp, Genital: NEGATIVE

## 2011-07-12 LAB — CULTURE, BETA STREP (GROUP B ONLY)

## 2011-07-13 ENCOUNTER — Other Ambulatory Visit (HOSPITAL_COMMUNITY): Payer: Self-pay | Admitting: Family

## 2011-07-13 DIAGNOSIS — O0990 Supervision of high risk pregnancy, unspecified, unspecified trimester: Secondary | ICD-10-CM

## 2011-07-13 DIAGNOSIS — O3680X0 Pregnancy with inconclusive fetal viability, not applicable or unspecified: Secondary | ICD-10-CM

## 2011-07-14 ENCOUNTER — Encounter: Payer: Self-pay | Admitting: Family

## 2011-07-14 ENCOUNTER — Ambulatory Visit (HOSPITAL_COMMUNITY)
Admission: RE | Admit: 2011-07-14 | Discharge: 2011-07-14 | Disposition: A | Discharge status: Home / Self Care | Payer: Medicaid Other | Source: Ambulatory Visit | Attending: Family | Admitting: Family

## 2011-07-14 ENCOUNTER — Telehealth: Payer: Self-pay | Admitting: Family

## 2011-07-14 DIAGNOSIS — B951 Streptococcus, group B, as the cause of diseases classified elsewhere: Secondary | ICD-10-CM | POA: Insufficient documentation

## 2011-07-14 DIAGNOSIS — O2340 Unspecified infection of urinary tract in pregnancy, unspecified trimester: Secondary | ICD-10-CM | POA: Insufficient documentation

## 2011-07-14 DIAGNOSIS — Z3689 Encounter for other specified antenatal screening: Secondary | ICD-10-CM | POA: Insufficient documentation

## 2011-07-14 DIAGNOSIS — O0990 Supervision of high risk pregnancy, unspecified, unspecified trimester: Secondary | ICD-10-CM

## 2011-07-14 MED ORDER — AMPICILLIN 500 MG PO CAPS: 500.0000 mg | ORAL_CAPSULE | Freq: Three times a day (TID) | ORAL | Status: AC

## 2011-07-14 NOTE — Telephone Encounter (Signed)
Pt with GBS in urine>RX Ampicillin sent to pharmacy>pt notified via phone.

## 2011-07-26 ENCOUNTER — Encounter: Payer: Self-pay | Admitting: Obstetrics & Gynecology

## 2011-07-30 ENCOUNTER — Encounter: Payer: Self-pay | Admitting: Family

## 2011-07-30 DIAGNOSIS — Z3490 Encounter for supervision of normal pregnancy, unspecified, unspecified trimester: Secondary | ICD-10-CM | POA: Insufficient documentation

## 2011-08-05 ENCOUNTER — Ambulatory Visit (INDEPENDENT_AMBULATORY_CARE_PROVIDER_SITE_OTHER): Payer: Self-pay | Admitting: Family Medicine

## 2011-08-05 ENCOUNTER — Encounter: Payer: Self-pay | Admitting: Family Medicine

## 2011-08-05 VITALS — BP 113/71 | Temp 97.4°F | Ht 68.0 in | Wt 192.7 lb

## 2011-08-05 DIAGNOSIS — O2340 Unspecified infection of urinary tract in pregnancy, unspecified trimester: Secondary | ICD-10-CM

## 2011-08-05 DIAGNOSIS — N390 Urinary tract infection, site not specified: Secondary | ICD-10-CM

## 2011-08-05 DIAGNOSIS — O99333 Smoking (tobacco) complicating pregnancy, third trimester: Secondary | ICD-10-CM

## 2011-08-05 DIAGNOSIS — O09219 Supervision of pregnancy with history of pre-term labor, unspecified trimester: Secondary | ICD-10-CM

## 2011-08-05 DIAGNOSIS — Z3490 Encounter for supervision of normal pregnancy, unspecified, unspecified trimester: Secondary | ICD-10-CM

## 2011-08-05 DIAGNOSIS — O34219 Maternal care for unspecified type scar from previous cesarean delivery: Secondary | ICD-10-CM

## 2011-08-05 DIAGNOSIS — O99330 Smoking (tobacco) complicating pregnancy, unspecified trimester: Secondary | ICD-10-CM

## 2011-08-05 DIAGNOSIS — O2390 Unspecified genitourinary tract infection in pregnancy, unspecified trimester: Secondary | ICD-10-CM

## 2011-08-05 DIAGNOSIS — B951 Streptococcus, group B, as the cause of diseases classified elsewhere: Secondary | ICD-10-CM

## 2011-08-05 LAB — POCT URINALYSIS DIP (DEVICE)
Glucose, UA: NEGATIVE mg/dL
Hgb urine dipstick: NEGATIVE
Ketones, ur: NEGATIVE mg/dL
Leukocytes, UA: NEGATIVE
Nitrite: NEGATIVE

## 2011-08-05 NOTE — Progress Notes (Signed)
New pt.  Needs new OB labs. H/o PTB--too late for 17 P  Subjective:    [name] is a [id number] [redacted]w[redacted]d being seen today for her first obstetrical visit.  Her obstetrical history is significant for h/o PTB. Patient does not intend to breast feed. Pregnancy history fully reviewed.  Patient reports no complaints.  Filed Vitals:   08/05/11 0817 08/05/11 0820  BP: 113/71   Temp: 97.4 F (36.3 C)   Height:  5\' 8"  (1.727 m)  Weight: 192 lb 11.2 oz (87.408 kg)     HISTORY: OB History    Grav Para Term Preterm Abortions TAB SAB Ect Mult Living   4 2 1 1 1 1  0 0 0 2     # Outc Date GA Lbr Len/2nd Wgt Sex Del Anes PTL Lv   1 TAB 2000           Comments: 1st trimester, no complications   2 TRM 11/03 [redacted]w[redacted]d  5lb7oz(2.466kg) M LTCS   Yes   Comments: bleeding- had embergency c/s   3 PRE 11/09 [redacted]w[redacted]d  5lb1oz(2.296kg) M VBAC None Yes Yes   4 CUR              Past Medical History  Diagnosis Date  . Urinary tract infection   . Preterm labor   . Trichomonas    Past Surgical History  Procedure Date  . Cesarean section   . Induced abortion    Family History  Problem Relation Age of Onset  . Other Neg Hx      Exam    Uterus:  Fundal Height: 29 cm  Pelvic Exam:    Perineum: Normal Perineum   Vulva: Bartholin's, Urethra, Skene's normal, female escutcheon   Vagina:  normal discharge       Cervix: multiparous appearance, no bleeding following Pap and no cervical motion tenderness   Adnexa: Could not assess secondary to advanced gestational age.   Bony Pelvis: average  System:     Skin: normal coloration and turgor, no rashes    Neurologic: oriented   Extremities: normal strength, tone, and muscle mass   HEENT sclera clear, anicteric   Mouth/Teeth mucous membranes moist, pharynx normal without lesions   Neck supple   Cardiovascular: regular rate and rhythm   Respiratory:  appears well, vitals normal, no respiratory distress, acyanotic, normal RR, ear and throat exam is  normal, neck free of mass or lymphadenopathy, chest clear, no wheezing, crepitations, rhonchi, normal symmetric air entry   Abdomen: soft, non-tender; bowel sounds normal; no masses,  no organomegaly   Urinary: bladder not palpable      Assessment:    Pregnancy: [id number] Patient Active Problem List  Diagnosis  . GBS (group B streptococcus) UTI complicating pregnancy  . Supervision of normal pregnancy  . Pregnancy with history of pre-term labor  . Previous cesarean delivery, antepartum condition or complication  . Tobacco smoking complicating pregnancy in third trimester        Plan:     Initial labs drawn. Prenatal vitamins. Problem list reviewed and updated. Genetic Screening discussed Quad Screen: too late.  Ultrasound discussed; fetal survey: results reviewed.  Follow up in 2 weeks.    Shaka Zech S 08/05/2011

## 2011-08-05 NOTE — Patient Instructions (Signed)
Pregnancy and Smoking Smoking during pregnancy is very unhealthy for the mother and the developing fetus. The addictive drug in cigarettes (nicotine), carbon monoxide, and many other poisons are inhaled from a cigarette and are carried through your bloodstream to your fetus. Cigarette smoke contains more than 2,500 chemicals. It is not known which of these chemicals are harmful to the developing fetus. However, both nicotine and carbon monoxide play a role in causing health problems in pregnancy. Effects on the fetus of smoking during pregnancy:  Decrease in blood flow and oxygen to the uterus, placenta, and your fetus.   Increased heart rate of the fetus.   Slowing of your fetus's growth in the uterus (intrauterine growth retardation).   Placental problems. Placenta may partially cover or completely cover the opening to the cervix (placenta previa), or the placenta may partially or completely separate from the uterus (placental abruption).   Increase risk of pregnancy outside of the uterus (tubal pregnancy).   Premature rupture membranes, causing the sac that holds the fetus to break too early, resulting in premature birth and increased health risks to the newborn.   Increased risk of birth defects, including heart defects.   Increased risk of miscarriage.  Newborns born to women who smoke during pregnancy:  Are more likely to be born too early (prematurely).   Are more likely to be at a low birth weight.   Are at risk for serious health problems, chronic or lifelong disabilities (cerebral palsy, mental retardation, learning problems), and possibly even death   Are at risk of Sudden Infant Death Syndrome (SIDS).   Have higher rates of miscarriage and stillbirth.   Have more lung and breathing (respiratory) problems.  Long-term effects on a child's behavior: Some of the following trends are seen with children of smoking mothers:  Increased risk for drug abuse, behavior, and  conduct disorders.   Increased risk for smoking in adolescent girls.   Increased risk for negative behavior in 2-year-olds.   Increase risk for asthma, colic, and childhood obesity, which can lead to diabetes.   Increased risk for finger and toe disorders.  Resources to stop smoking during pregnancy:  Counseling.   Psychological treatment.   Acupuncture.   Family intervention.   Hypnosis.   Medicines that are safe to take during pregnancy. Nicotine supplements have not been studied enough. They should only be considered when all other methods fail.   Telephone QUIT lines.  Smoking and Breastfeeding:  Nicotine gets passed to the infant through a mother's breastmilk. This can cause nausea, colic, cramping, and diarrhea in the infant.   Smoking may reduce milk supply and interfere with the let-down response.   Even formula-fed infants of mothers who smoke have nicotine and cotinine (nicotine by-product) in their urine.  Other resources to help stop smoking:  American Cancer Society: www.cancer.org   American Heart Association: www.americanheart.org   National Cancer Institute: www.cancer.gov   Smoke Free Families: www.smokefreefamilies.[address] [place] Line): 231-823-4856 START  Document Released: 05/04/2004 Document Revised: 12/10/2010 Document Reviewed: 10/02/2008 [facility] Patient Information 2012 [place], [place]. Breastfeeding BENEFITS OF BREASTFEEDING For the baby  The first milk (colostrum) helps the baby's digestive system function better.   There are antibodies from the mother in the milk that help the baby fight off infections.   The baby has a lower incidence of asthma, allergies, and SIDS (sudden infant death syndrome).   The nutrients in breast milk are better than formulas for the baby and helps the baby's  brain grow better.   Babies who breastfeed have less gas, colic, and constipation.  For the mother  Breastfeeding helps develop a very special  bond between mother and baby.   It is more convenient, always available at the correct temperature and cheaper than formula feeding.   It burns calories in the mother and helps with losing weight that was gained during pregnancy.   It makes the uterus contract back down to normal size faster and slows bleeding following delivery.   Breastfeeding mothers have a lower risk of developing breast cancer.  NURSE FREQUENTLY  A healthy, full-term baby may breastfeed as often as every hour or space his or her feedings to every 3 hours.   How often to nurse will vary from baby to baby. Watch your baby for signs of hunger, not the clock.   Nurse as often as the baby requests, or when you feel the need to reduce the fullness of your breasts.   Awaken the baby if it has been 3 to 4 hours since the last feeding.   Frequent feeding will help the mother make more milk and will prevent problems like sore nipples and engorgement of the breasts.  BABY'S POSITION AT THE BREAST  Whether lying down or sitting, be sure that the baby's tummy is facing your tummy.   Support the breast with 4 fingers underneath the breast and the thumb above. Make sure your fingers are well away from the nipple and baby's mouth.   Stroke the baby's lips and cheek closest to the breast gently with your finger or nipple.   When the baby's mouth is open wide enough, place all of your nipple and as much of the dark area around the nipple as possible into your baby's mouth.   Pull the baby in close so the tip of the nose and the baby's cheeks touch the breast during the feeding.  FEEDINGS  The length of each feeding varies from baby to baby and from feeding to feeding.   The baby must suck about 2 to 3 minutes for your milk to get to him or her. This is called a "let down." For this reason, allow the baby to feed on each breast as long as he or she wants. Your baby will end the feeding when he or she has received the right  balance of nutrients.   To break the suction, put your finger into the corner of the baby's mouth and slide it between his or her gums before removing your breast from his or her mouth. This will help prevent sore nipples.  REDUCING BREAST ENGORGEMENT  In the first week after your baby is born, you may experience signs of breast engorgement. When breasts are engorged, they feel heavy, warm, full, and may be tender to the touch. You can reduce engorgement if you:   Nurse frequently, every 2 to 3 hours. Mothers who breastfeed early and often have fewer problems with engorgement.   Place light ice packs on your breasts between feedings. This reduces swelling. Wrap the ice packs in a lightweight towel to protect your skin.   Apply moist hot packs to your breast for 5 to 10 minutes before each feeding. This increases circulation and helps the milk flow.   Gently massage your breast before and during the feeding.   Make sure that the baby empties at least one breast at every feeding before switching sides.   Use a breast pump to empty the breasts if  your baby is sleepy or not nursing well. You may also want to pump if you are returning to work or or you feel you are getting engorged.   Avoid bottle feeds, pacifiers or supplemental feedings of water or juice in place of breastfeeding.   Be sure the baby is latched on and positioned properly while breastfeeding.   Prevent fatigue, stress, and anemia.   Wear a supportive bra, avoiding underwire styles.   Eat a balanced diet with enough fluids.  If you follow these suggestions, your engorgement should improve in 24 to 48 hours. If you are still experiencing difficulty, call your lactation consultant or caregiver. IS MY BABY GETTING ENOUGH MILK? Sometimes, mothers worry about whether their babies are getting enough milk. You can be assured that your baby is getting enough milk if:  The baby is actively sucking and you hear swallowing.   The  baby nurses at least 8 to 12 times in a 24 hour time period. Nurse your baby until he or she unlatches or falls asleep at the first breast (at least 10 to 20 minutes), then offer the second side.   The baby is wetting 5 to 6 disposable diapers (6 to 8 cloth diapers) in a 24 hour period by 64 to 53 days of age.   The baby is having at least 2 to 3 stools every 24 hours for the first few months. Breast milk is all the food your baby needs. It is not necessary for your baby to have water or formula. In fact, to help your breasts make more milk, it is best not to give your baby supplemental feedings during the early weeks.   The stool should be soft and yellow.   The baby should gain 4 to 7 ounces per week after he is 85 days old.  TAKE CARE OF YOURSELF Take care of your breasts by:  Bathing or showering daily.   Avoiding the use of soaps on your nipples.   Start feedings on your left breast at one feeding and on your right breast at the next feeding.   You will notice an increase in your milk supply 2 to 5 days after delivery. You may feel some discomfort from engorgement, which makes your breasts very firm and often tender. Engorgement "peaks" out within 24 to 48 hours. In the meantime, apply warm moist towels to your breasts for 5 to 10 minutes before feeding. Gentle massage and expression of some milk before feeding will soften your breasts, making it easier for your baby to latch on. Wear a well fitting nursing bra and air dry your nipples for 10 to 15 minutes after each feeding.   Only use cotton bra pads.   Only use pure lanolin on your nipples after nursing. You do not need to wash it off before nursing.  Take care of yourself by:   Eating well-balanced meals and nutritious snacks.   Drinking milk, fruit juice, and water to satisfy your thirst (about 8 glasses a day).   Getting plenty of rest.   Increasing calcium in your diet (1200 mg a day).   Avoiding foods that you notice affect  the baby in a bad way.  SEEK MEDICAL CARE IF:   You have any questions or difficulty with breastfeeding.   You need help.   You have a hard, red, sore area on your breast, accompanied by a fever of 100.5 F (38.1 C) or more.   Your baby is too sleepy to  eat well or is having trouble sleeping.   Your baby is wetting less than 6 diapers per day, by 34 days of age.   Your baby's skin or white part of his or her eyes is more yellow than it was in the hospital.   You feel depressed.  Document Released: 12/21/2004 Document Revised: 12/10/2010 Document Reviewed: 08/05/2008 [facility] Patient Information 2012 [place], [place].

## 2011-08-05 NOTE — Progress Notes (Signed)
Pulse- 84 Education booklet given.  Declines WIC info. Tdap vaccine info given.   Discuss weight gain of 25-35lbs

## 2011-08-05 NOTE — Progress Notes (Signed)
Nutrition Note:  1st visit Diagnosis:  Hx. Of preterm birth.  General nutrition counseling. Weight of 192.7# today.  Adequate weight gain during pregnancy Pt given verbal and written general nutrition counseling.  Eats well per client.  Drinks water, 2% milk, tea & juice.  No n/v reported.  PNV daily. Receives [facility] Client agrees to limit sweetened beverages and juice intake.  Discussed 15-25# weight gain. Wilford Sports, MPH, RD, LDN

## 2011-08-06 LAB — OBSTETRIC PANEL
Antibody Screen: NEGATIVE
Basophils Absolute: 0 10*3/uL (ref 0.0–0.1)
Basophils Relative: 0 % (ref 0–1)
Eosinophils Absolute: 0.2 10*3/uL (ref 0.0–0.7)
Eosinophils Relative: 2 % (ref 0–5)
HCT: 35.5 % — ABNORMAL LOW (ref 36.0–46.0)
Lymphocytes Relative: 27 % (ref 12–46)
MCH: 32.1 pg (ref 26.0–34.0)
MCHC: 34.9 g/dL (ref 30.0–36.0)
MCV: 92 fL (ref 78.0–100.0)
Monocytes Absolute: 0.5 10*3/uL (ref 0.1–1.0)
Platelets: 273 10*3/uL (ref 150–400)
RDW: 13.3 % (ref 11.5–15.5)
Rubella: 133.7 IU/mL — ABNORMAL HIGH

## 2011-08-07 LAB — CULTURE, OB URINE: Colony Count: 30000

## 2011-08-09 LAB — HEMOGLOBINOPATHY EVALUATION: Hgb S Quant: 0 %

## 2011-08-10 ENCOUNTER — Encounter: Payer: Self-pay | Admitting: Family Medicine

## 2011-08-19 ENCOUNTER — Ambulatory Visit (INDEPENDENT_AMBULATORY_CARE_PROVIDER_SITE_OTHER): Payer: Self-pay | Admitting: Family

## 2011-08-19 VITALS — BP 105/74 | Temp 97.3°F | Wt 191.5 lb

## 2011-08-19 DIAGNOSIS — O09219 Supervision of pregnancy with history of pre-term labor, unspecified trimester: Secondary | ICD-10-CM

## 2011-08-19 DIAGNOSIS — O34219 Maternal care for unspecified type scar from previous cesarean delivery: Secondary | ICD-10-CM

## 2011-08-19 LAB — POCT URINALYSIS DIP (DEVICE)
Glucose, UA: NEGATIVE mg/dL
Leukocytes, UA: NEGATIVE
Nitrite: NEGATIVE

## 2011-08-19 NOTE — Progress Notes (Signed)
Pulse: 90

## 2011-08-19 NOTE — Progress Notes (Signed)
No questions or concerns; (urine results not available); TOLAC consent signed; visit with Child psychotherapist.

## 2011-09-02 ENCOUNTER — Ambulatory Visit (INDEPENDENT_AMBULATORY_CARE_PROVIDER_SITE_OTHER): Payer: Self-pay | Admitting: Obstetrics & Gynecology

## 2011-09-02 VITALS — BP 120/87 | Temp 97.3°F | Wt 193.6 lb

## 2011-09-02 DIAGNOSIS — N390 Urinary tract infection, site not specified: Secondary | ICD-10-CM

## 2011-09-02 DIAGNOSIS — O2390 Unspecified genitourinary tract infection in pregnancy, unspecified trimester: Secondary | ICD-10-CM

## 2011-09-02 DIAGNOSIS — B951 Streptococcus, group B, as the cause of diseases classified elsewhere: Secondary | ICD-10-CM

## 2011-09-02 LAB — POCT URINALYSIS DIP (DEVICE): Ketones, ur: 40 mg/dL — AB

## 2011-09-02 NOTE — Progress Notes (Signed)
Pulse- 116  Edema- legs/feet

## 2011-09-02 NOTE — Patient Instructions (Signed)

## 2011-09-02 NOTE — Progress Notes (Signed)
No complaints. PTL precautions given

## 2011-09-16 ENCOUNTER — Ambulatory Visit (INDEPENDENT_AMBULATORY_CARE_PROVIDER_SITE_OTHER): Payer: Medicaid Other | Admitting: Obstetrics & Gynecology

## 2011-09-16 VITALS — BP 129/85 | Temp 97.3°F | Wt 198.0 lb

## 2011-09-16 DIAGNOSIS — O09219 Supervision of pregnancy with history of pre-term labor, unspecified trimester: Secondary | ICD-10-CM

## 2011-09-16 LAB — POCT URINALYSIS DIP (DEVICE)
Glucose, UA: NEGATIVE mg/dL
Ketones, ur: NEGATIVE mg/dL
Specific Gravity, Urine: 1.025 (ref 1.005–1.030)

## 2011-09-16 NOTE — Progress Notes (Signed)
No problems. STD testing done.

## 2011-09-16 NOTE — Patient Instructions (Signed)

## 2011-09-16 NOTE — Progress Notes (Signed)
P=101, c/o intermittent edema in feet/legs,

## 2011-09-17 LAB — GC/CHLAMYDIA PROBE AMP, GENITAL
Chlamydia, DNA Probe: NEGATIVE
GC Probe Amp, Genital: NEGATIVE

## 2011-09-19 ENCOUNTER — Encounter (HOSPITAL_COMMUNITY): Payer: Self-pay | Admitting: *Deleted

## 2011-09-19 ENCOUNTER — Inpatient Hospital Stay (HOSPITAL_COMMUNITY)
Admission: AD | Admit: 2011-09-19 | Discharge: 2011-09-22 | DRG: 775 | Disposition: A | Discharge status: Home / Self Care | Payer: Medicaid Other | Source: Ambulatory Visit | Attending: Obstetrics & Gynecology | Admitting: Obstetrics & Gynecology

## 2011-09-19 DIAGNOSIS — O99333 Smoking (tobacco) complicating pregnancy, third trimester: Secondary | ICD-10-CM

## 2011-09-19 DIAGNOSIS — O09219 Supervision of pregnancy with history of pre-term labor, unspecified trimester: Secondary | ICD-10-CM

## 2011-09-19 DIAGNOSIS — B951 Streptococcus, group B, as the cause of diseases classified elsewhere: Secondary | ICD-10-CM

## 2011-09-19 DIAGNOSIS — O99334 Smoking (tobacco) complicating childbirth: Secondary | ICD-10-CM | POA: Diagnosis present

## 2011-09-19 DIAGNOSIS — Z22330 Carrier of Group B streptococcus: Secondary | ICD-10-CM

## 2011-09-19 DIAGNOSIS — O4290 Premature rupture of membranes, unspecified as to length of time between rupture and onset of labor, unspecified weeks of gestation: Principal | ICD-10-CM | POA: Diagnosis present

## 2011-09-19 DIAGNOSIS — Z3490 Encounter for supervision of normal pregnancy, unspecified, unspecified trimester: Secondary | ICD-10-CM

## 2011-09-19 DIAGNOSIS — O99892 Other specified diseases and conditions complicating childbirth: Secondary | ICD-10-CM | POA: Diagnosis present

## 2011-09-19 DIAGNOSIS — O34219 Maternal care for unspecified type scar from previous cesarean delivery: Secondary | ICD-10-CM

## 2011-09-19 LAB — CBC
HCT: 36.5 % (ref 36.0–46.0)
Hemoglobin: 12.6 g/dL (ref 12.0–15.0)
MCV: 92.6 fL (ref 78.0–100.0)
RDW: 13.2 % (ref 11.5–15.5)
WBC: 8.8 10*3/uL (ref 4.0–10.5)

## 2011-09-19 MED ORDER — PENICILLIN G POTASSIUM 5000000 UNITS IJ SOLR
5.0000 10*6.[IU] | Freq: Once | INTRAVENOUS | Status: AC
  Administered 2011-09-19: 5 10*6.[IU] via INTRAVENOUS
  Filled 2011-09-19: qty 5

## 2011-09-19 MED ORDER — PENICILLIN G POTASSIUM 5000000 UNITS IJ SOLR
2.5000 10*6.[IU] | INTRAMUSCULAR | Status: DC
  Administered 2011-09-20 (×2): 2.5 10*6.[IU] via INTRAVENOUS
  Filled 2011-09-19 (×4): qty 2.5

## 2011-09-19 NOTE — MAU Provider Note (Signed)
Chief Complaint:  Rupture of Membranes   First Provider Initiated Contact with Patient 09/19/11 2147      HPI: [name] is a 26 y.o. [id number] at 38w6dwho presents to maternity admissions reporting leakage of fluid. Pt states that she was getting up to walk to the kitchen at 2000 today and had a big gush of fluid. Ever since then, she has been leaking a steady amount of clear fluid.    Denies contractions,  vaginal bleeding. Good fetal movement.   Pregnancy Course:   Past Medical History: Past Medical History  Diagnosis Date  . Urinary tract infection   . Preterm labor   . Trichomonas     Past obstetric history: OB History    Grav Para Term Preterm Abortions TAB SAB Ect Mult Living   4 2 1 1 1 1  0 0 0 2     # Outc Date GA Lbr Len/2nd Wgt Sex Del Anes PTL Lv   1 TAB 2000           Comments: 1st trimester, no complications   2 TRM 11/03 [redacted]w[redacted]d  5lb7oz(2.466kg) M LTCS   Yes   Comments: bleeding- had embergency c/s   3 PRE 11/09 [redacted]w[redacted]d  5lb1oz(2.296kg) M VBAC None Yes Yes   4 CUR               Past Surgical History: Past Surgical History  Procedure Date  . Cesarean section   . Induced abortion     Family History: Family History  Problem Relation Age of Onset  . Other Neg Hx     Social History: History  Substance Use Topics  . Smoking status: Current Every Day Smoker -- 0.2 packs/day for 7 years    Types: Cigarettes  . Smokeless tobacco: Never Used  . Alcohol Use: No    Allergies: No Known Allergies  Meds:  Prescriptions prior to admission  Medication Sig Dispense Refill  . Prenatal Vit-Fe Fumarate-FA (PRENATAL COMPLETE) 14-0.4 MG TABS Take 1 tablet by mouth daily.  60 each  0    ROS: Pertinent findings in history of present illness.  Physical Exam  Blood pressure 127/82, pulse 94, temperature 98.3 F (36.8 C), temperature source Oral, resp. rate 20, height 5\' 8"  (1.727 m), weight 91.173 kg (201 lb), SpO2 100.00%. GENERAL: Well-developed,  well-nourished female in no acute distress.  HEENT: normocephalic HEART: normal rate RESP: normal effort ABDOMEN: Soft, non-tender, gravid appropriate for gestational age EXTREMITIES: Nontender, no edema NEURO: alert and oriented   Dilation: 2 Effacement (%): 50;70 Cervical Position: Middle Station: -3 Presentation: Vertex Exam by:: Ginger Morris RN  FHT:  Baseline 140s, moderate variability, accelerations present, no decelerations Contractions: uterine irritability   Labs: No results found for this or any previous visit (from the past 24 hour(s)).  Imaging:  No results found. ED Course   Assessment: 1. Pregnancy with history of pre-term labor   2. Previous cesarean delivery, antepartum condition or complication   3. Tobacco smoking complicating pregnancy in third trimester   4. Supervision of normal pregnancy   5. GBS (group B streptococcus) UTI complicating pregnancy     Plan:  1) SROM - admit to L&D - induction with pitocin  - routine orders  2) FWB - Category I tracing - GBS positive, now ruptured, will start PCN - EFW 6#  3) anticipate SVD    Medication List     As of 09/19/2011 10:01 PM    ASK your doctor about these  medications         Prenatal Complete 14-0.4 MG Tabs   Take 1 tablet by mouth daily.        Rulon Abide Medical Resident 09/19/2011 10:01 PM

## 2011-09-19 NOTE — H&P (Signed)
Chief Complaint:  Rupture of Membranes   First Provider Initiated Contact with Patient 09/19/11 2147      HPI: [name] is a 26 y.o. [id number] at 37w6dwho presents to maternity admissions reporting leakage of fluid. Pt states that she was getting up to walk to the kitchen at 2000 today and had a big gush of fluid. Ever since then, she has been leaking a steady amount of clear fluid.    Denies contractions,  vaginal bleeding. Good fetal movement.   Pregnancy Course:   Past Medical History: Past Medical History  Diagnosis Date  . Urinary tract infection   . Preterm labor   . Trichomonas     Past obstetric history: OB History    Grav Para Term Preterm Abortions TAB SAB Ect Mult Living   4 2 1 1 1 1  0 0 0 2     # Outc Date GA Lbr Len/2nd Wgt Sex Del Anes PTL Lv   1 TAB 2000           Comments: 1st trimester, no complications   2 TRM 11/03 [redacted]w[redacted]d  5lb7oz(2.466kg) M LTCS   Yes   Comments: bleeding- had embergency c/s   3 PRE 11/09 [redacted]w[redacted]d  5lb1oz(2.296kg) M VBAC None Yes Yes   4 CUR               Past Surgical History: Past Surgical History  Procedure Date  . Cesarean section   . Induced abortion     Family History: Family History  Problem Relation Age of Onset  . Other Neg Hx     Social History: History  Substance Use Topics  . Smoking status: Current Every Day Smoker -- 0.2 packs/day for 7 years    Types: Cigarettes  . Smokeless tobacco: Never Used  . Alcohol Use: No    Allergies: No Known Allergies  Meds:  Prescriptions prior to admission  Medication Sig Dispense Refill  . Prenatal Vit-Fe Fumarate-FA (PRENATAL COMPLETE) 14-0.4 MG TABS Take 1 tablet by mouth daily.  60 each  0    ROS: Pertinent findings in history of present illness.  Physical Exam  Blood pressure 127/82, pulse 94, temperature 98.3 F (36.8 C), temperature source Oral, resp. rate 20, height 5\' 8"  (1.727 m), weight 91.173 kg (201 lb), SpO2 100.00%. GENERAL: Well-developed,  well-nourished female in no acute distress.  HEENT: normocephalic HEART: normal rate RESP: normal effort ABDOMEN: Soft, non-tender, gravid appropriate for gestational age EXTREMITIES: Nontender, no edema NEURO: alert and oriented   Dilation: 2 Effacement (%): 50;70 Cervical Position: Middle Station: -3 Presentation: Vertex Exam by:: Ginger Morris RN  FHT:  Baseline 140s, moderate variability, accelerations present, no decelerations Contractions: uterine irritability   Labs: No results found for this or any previous visit (from the past 24 hour(s)).  Imaging:  No results found. ED Course   Assessment: 1. Pregnancy with history of pre-term labor   2. Previous cesarean delivery, antepartum condition or complication   3. Tobacco smoking complicating pregnancy in third trimester   4. Supervision of normal pregnancy   5. GBS (group B streptococcus) UTI complicating pregnancy     Plan:  1) SROM - admit to L&D - augmentation with pitocin  - hx of c/s with previous successful VBAC.  Wants to VBAC again.  - routine orders  2) FWB - Category I tracing - GBS positive, now ruptured, will start PCN - EFW 6#  3) anticipate SVD    Medication List  As of 09/19/2011 10:01 PM    ASK your doctor about these medications         Prenatal Complete 14-0.4 MG Tabs   Take 1 tablet by mouth daily.        Rulon Abide Medical Resident 09/19/2011 10:01 PM

## 2011-09-19 NOTE — MAU Note (Signed)
Pt reports ROM at 2000, denies contractions.

## 2011-09-20 ENCOUNTER — Encounter (HOSPITAL_COMMUNITY): Payer: Self-pay | Admitting: Anesthesiology

## 2011-09-20 ENCOUNTER — Encounter (HOSPITAL_COMMUNITY): Payer: Self-pay | Admitting: *Deleted

## 2011-09-20 ENCOUNTER — Inpatient Hospital Stay (HOSPITAL_COMMUNITY): Payer: Medicaid Other | Admitting: Anesthesiology

## 2011-09-20 DIAGNOSIS — Z22330 Carrier of Group B streptococcus: Secondary | ICD-10-CM

## 2011-09-20 DIAGNOSIS — O9989 Other specified diseases and conditions complicating pregnancy, childbirth and the puerperium: Secondary | ICD-10-CM

## 2011-09-20 DIAGNOSIS — O99334 Smoking (tobacco) complicating childbirth: Secondary | ICD-10-CM

## 2011-09-20 DIAGNOSIS — O34219 Maternal care for unspecified type scar from previous cesarean delivery: Secondary | ICD-10-CM

## 2011-09-20 LAB — TYPE AND SCREEN: Antibody Screen: NEGATIVE

## 2011-09-20 MED ORDER — LIDOCAINE HCL (PF) 1 % IJ SOLN
INTRAMUSCULAR | Status: DC | PRN
  Administered 2011-09-20 (×2): 4 mL

## 2011-09-20 MED ORDER — LACTATED RINGERS IV SOLN
500.0000 mL | Freq: Once | INTRAVENOUS | Status: AC
  Administered 2011-09-20: 500 mL via INTRAVENOUS

## 2011-09-20 MED ORDER — EPHEDRINE 5 MG/ML INJ
10.0000 mg | INTRAVENOUS | Status: DC | PRN
  Filled 2011-09-20: qty 4

## 2011-09-20 MED ORDER — ONDANSETRON HCL 4 MG/2ML IJ SOLN: 4.0000 mg | Freq: Four times a day (QID) | INTRAMUSCULAR | Status: DC | PRN

## 2011-09-20 MED ORDER — WITCH HAZEL-GLYCERIN EX PADS: 1.0000 "application " | MEDICATED_PAD | CUTANEOUS | Status: DC | PRN

## 2011-09-20 MED ORDER — ACETAMINOPHEN 325 MG PO TABS: 650.0000 mg | ORAL_TABLET | ORAL | Status: DC | PRN

## 2011-09-20 MED ORDER — LACTATED RINGERS IV SOLN
INTRAVENOUS | Status: DC
  Administered 2011-09-20: 05:00:00 via INTRAVENOUS

## 2011-09-20 MED ORDER — SIMETHICONE 80 MG PO CHEW: 80.0000 mg | CHEWABLE_TABLET | ORAL | Status: DC | PRN

## 2011-09-20 MED ORDER — LIDOCAINE HCL (PF) 1 % IJ SOLN: 30.0000 mL | INTRAMUSCULAR | Status: DC | PRN

## 2011-09-20 MED ORDER — CITRIC ACID-SODIUM CITRATE 334-500 MG/5ML PO SOLN: 30.0000 mL | ORAL | Status: DC | PRN

## 2011-09-20 MED ORDER — FLEET ENEMA 7-19 GM/118ML RE ENEM: 1.0000 | ENEMA | RECTAL | Status: DC | PRN

## 2011-09-20 MED ORDER — LACTATED RINGERS IV SOLN: 500.0000 mL | INTRAVENOUS | Status: DC | PRN

## 2011-09-20 MED ORDER — DIBUCAINE 1 % RE OINT
1.0000 "application " | TOPICAL_OINTMENT | RECTAL | Status: DC | PRN
  Filled 2011-09-20: qty 28

## 2011-09-20 MED ORDER — PHENYLEPHRINE 40 MCG/ML (10ML) SYRINGE FOR IV PUSH (FOR BLOOD PRESSURE SUPPORT)
80.0000 ug | PREFILLED_SYRINGE | INTRAVENOUS | Status: DC | PRN
  Filled 2011-09-20: qty 5

## 2011-09-20 MED ORDER — SENNOSIDES-DOCUSATE SODIUM 8.6-50 MG PO TABS
2.0000 | ORAL_TABLET | Freq: Every day | ORAL | Status: DC
  Administered 2011-09-20: 2 via ORAL

## 2011-09-20 MED ORDER — TETANUS-DIPHTH-ACELL PERTUSSIS 5-2.5-18.5 LF-MCG/0.5 IM SUSP: 0.5000 mL | Freq: Once | INTRAMUSCULAR | Status: DC

## 2011-09-20 MED ORDER — DIPHENHYDRAMINE HCL 50 MG/ML IJ SOLN: 12.5000 mg | INTRAMUSCULAR | Status: DC | PRN

## 2011-09-20 MED ORDER — BUTORPHANOL TARTRATE 1 MG/ML IJ SOLN: 1.0000 mg | INTRAMUSCULAR | Status: DC | PRN

## 2011-09-20 MED ORDER — OXYTOCIN 40 UNITS IN LACTATED RINGERS INFUSION - SIMPLE MED
1.0000 m[IU]/min | INTRAVENOUS | Status: DC
  Administered 2011-09-20: 1 m[IU]/min via INTRAVENOUS

## 2011-09-20 MED ORDER — OXYCODONE-ACETAMINOPHEN 5-325 MG PO TABS: 1.0000 | ORAL_TABLET | ORAL | Status: DC | PRN

## 2011-09-20 MED ORDER — FENTANYL 2.5 MCG/ML BUPIVACAINE 1/10 % EPIDURAL INFUSION (WH - ANES)
14.0000 mL/h | INTRAMUSCULAR | Status: DC
  Filled 2011-09-20: qty 60

## 2011-09-20 MED ORDER — ONDANSETRON HCL 4 MG/2ML IJ SOLN: 4.0000 mg | INTRAMUSCULAR | Status: DC | PRN

## 2011-09-20 MED ORDER — BENZOCAINE-MENTHOL 20-0.5 % EX AERO
1.0000 "application " | INHALATION_SPRAY | CUTANEOUS | Status: DC | PRN
  Filled 2011-09-20: qty 56

## 2011-09-20 MED ORDER — IBUPROFEN 600 MG PO TABS: 600.0000 mg | ORAL_TABLET | Freq: Four times a day (QID) | ORAL | Status: DC | PRN

## 2011-09-20 MED ORDER — OXYCODONE-ACETAMINOPHEN 5-325 MG PO TABS
1.0000 | ORAL_TABLET | ORAL | Status: DC | PRN
  Administered 2011-09-20 (×2): 1 via ORAL
  Filled 2011-09-20 (×2): qty 1

## 2011-09-20 MED ORDER — OXYTOCIN 40 UNITS IN LACTATED RINGERS INFUSION - SIMPLE MED: 62.5000 mL/h | Freq: Once | INTRAVENOUS | Status: DC

## 2011-09-20 MED ORDER — FENTANYL 2.5 MCG/ML BUPIVACAINE 1/10 % EPIDURAL INFUSION (WH - ANES)
INTRAMUSCULAR | Status: DC | PRN
  Administered 2011-09-20: 14 mL/h via EPIDURAL

## 2011-09-20 MED ORDER — LANOLIN HYDROUS EX OINT: TOPICAL_OINTMENT | CUTANEOUS | Status: DC | PRN

## 2011-09-20 MED ORDER — TERBUTALINE SULFATE 1 MG/ML IJ SOLN: 0.2500 mg | Freq: Once | INTRAMUSCULAR | Status: DC | PRN

## 2011-09-20 MED ORDER — ONDANSETRON HCL 4 MG PO TABS: 4.0000 mg | ORAL_TABLET | ORAL | Status: DC | PRN

## 2011-09-20 MED ORDER — OXYTOCIN 40 UNITS IN LACTATED RINGERS INFUSION - SIMPLE MED
1.0000 m[IU]/min | INTRAVENOUS | Status: DC
  Filled 2011-09-20: qty 1000

## 2011-09-20 MED ORDER — OXYTOCIN BOLUS FROM INFUSION
500.0000 mL | Freq: Once | INTRAVENOUS | Status: AC
  Administered 2011-09-20: 500 mL via INTRAVENOUS
  Filled 2011-09-20: qty 500

## 2011-09-20 MED ORDER — DIPHENHYDRAMINE HCL 25 MG PO CAPS: 25.0000 mg | ORAL_CAPSULE | Freq: Four times a day (QID) | ORAL | Status: DC | PRN

## 2011-09-20 MED ORDER — EPHEDRINE 5 MG/ML INJ: 10.0000 mg | INTRAVENOUS | Status: DC | PRN

## 2011-09-20 MED ORDER — IBUPROFEN 600 MG PO TABS
600.0000 mg | ORAL_TABLET | Freq: Four times a day (QID) | ORAL | Status: DC
  Administered 2011-09-20 – 2011-09-22 (×7): 600 mg via ORAL
  Filled 2011-09-20 (×7): qty 1

## 2011-09-20 MED ORDER — ZOLPIDEM TARTRATE 5 MG PO TABS: 5.0000 mg | ORAL_TABLET | Freq: Every evening | ORAL | Status: DC | PRN

## 2011-09-20 MED ORDER — PHENYLEPHRINE 40 MCG/ML (10ML) SYRINGE FOR IV PUSH (FOR BLOOD PRESSURE SUPPORT): 80.0000 ug | PREFILLED_SYRINGE | INTRAVENOUS | Status: DC | PRN

## 2011-09-20 MED ORDER — PRENATAL MULTIVITAMIN CH
1.0000 | ORAL_TABLET | Freq: Every day | ORAL | Status: DC
  Administered 2011-09-20 – 2011-09-22 (×3): 1 via ORAL
  Filled 2011-09-20 (×3): qty 1

## 2011-09-20 NOTE — Anesthesia Procedure Notes (Signed)
Epidural Patient location during procedure: OB Start time: 09/20/2011 8:06 AM  Staffing Anesthesiologist: Cherilyn Sautter A. Performed by: anesthesiologist   Preanesthetic Checklist Completed: patient identified, site marked, surgical consent, pre-op evaluation, timeout performed, IV checked, risks and benefits discussed and monitors and equipment checked  Epidural Patient position: sitting Prep: site prepped and draped and DuraPrep Patient monitoring: continuous pulse ox and blood pressure Approach: midline Injection technique: LOR air  Needle:  Needle type: Tuohy  Needle gauge: 17 G Needle length: 9 cm and 9 Needle insertion depth: 5 cm cm Catheter type: closed end flexible Catheter size: 19 Gauge Catheter at skin depth: 10 cm Test dose: negative and Other  Assessment Events: blood not aspirated, injection not painful, no injection resistance, negative IV test and no paresthesia  Additional Notes Patient identified. Risks and benefits discussed including failed block, incomplete  Pain control, post dural puncture headache, nerve damage, paralysis, blood pressure Changes, nausea, vomiting, reactions to medications-both toxic and allergic and post Partum back pain. All questions were answered. Patient expressed understanding and wished to proceed. Sterile technique was used throughout procedure. Epidural site was Dressed with sterile barrier dressing. No paresthesias, signs of intravascular injection Or signs of intrathecal spread were encountered.  Patient was more comfortable after the epidural was dosed. Please see RN's note for documentation of vital signs and FHR which are stable.

## 2011-09-20 NOTE — H&P (Signed)
I examined pt and agree with documentation above and resident plan of care. MUHAMMAD,WALIDAH  

## 2011-09-20 NOTE — Progress Notes (Signed)
[name] is a 26 y.o. [id number] at [redacted]w[redacted]d by ultrasound admitted for rupture of membranes  Subjective: Pt doing well and comfortable now. Starting to feel contractions although they are not severe. Does not want epidural yet.   Objective: BP 114/69  Pulse 75  Temp 97.6 F (36.4 C) (Oral)  Resp 18  Ht 5\' 8"  (1.727 m)  Wt 91.173 kg (201 lb)  BMI 30.56 kg/m2  SpO2 100%      FHT:  FHR: 135 bpm, variability: moderate,  accelerations:  Present,  decelerations:  Absent UC:   regular, every 3-5 minutes, not tracing for last 20 min SVE:   Dilation: 3.5 Effacement (%): 70 Station: -3 Exam by:: k. Jazelle Achey, md  Labs: Lab Results  Component Value Date   WBC 8.8 09/19/2011   HGB 12.6 09/19/2011   HCT 36.5 09/19/2011   MCV 92.6 09/19/2011   PLT 269 09/19/2011    Assessment / Plan: SROM and now induction with pitocin  Labor: progressing on pitocin. Currently at 7 mu/min Preeclampsia:  na Fetal Wellbeing:  Category I Pain Control:  Labor support without medications I/D:  n/a Anticipated MOD:  NSVD  Rulon Abide 09/20/2011, 5:13 AM

## 2011-09-20 NOTE — Anesthesia Preprocedure Evaluation (Signed)

## 2011-09-20 NOTE — Progress Notes (Signed)
UR chart review completed.  

## 2011-09-20 NOTE — Progress Notes (Signed)
[name] is a 26 y.o. [id number] at [redacted]w[redacted]d by LMP admitted for rupture of membranes  Subjective: Pt comfortable. Contracting on toco but not feeling them.  +LOF, no vb. +FM  Objective: BP 122/75  Pulse 91  Temp 98.3 F (36.8 C) (Oral)  Resp 20  Ht 5\' 8"  (1.727 m)  Wt 91.173 kg (201 lb)  BMI 30.56 kg/m2  SpO2 100%      FHT:  FHR: 135 bpm, variability: moderate,  accelerations:  Present,  decelerations:  Absent UC:   irregular, every 3-6 minutes SVE:   Dilation: 3 Effacement (%): 60 Station: -3 Exam by:: k. Halvor Behrend, md  Labs: Lab Results  Component Value Date   WBC 8.8 09/19/2011   HGB 12.6 09/19/2011   HCT 36.5 09/19/2011   MCV 92.6 09/19/2011   PLT 269 09/19/2011    Assessment / Plan: induction of labor for SROM   Labor: TOLAC- will start pitocin slowly 1/1 protocol Preeclampsia:  na Fetal Wellbeing:  Category I Pain Control:  Labor support without medications I/D:  n/a Anticipated MOD:  NSVD  Reola Calkins, Ernest Orr 09/20/2011, 1:05 AM

## 2011-09-20 NOTE — MAU Provider Note (Signed)
I examined pt and agree with documentation above and resident plan of care. MUHAMMAD,Ariyona Eid  

## 2011-09-20 NOTE — Progress Notes (Signed)
ZULA HOVSEPIAN is a 26 y.o. [id number] at [redacted]w[redacted]d by ultrasound admitted for rupture of membranes  Subjective: Pt still comfortable through her contractions. On Pitocin at 4 now.  Tolerating well.   Objective: BP 111/82  Pulse 91  Temp 97.6 F (36.4 C) (Oral)  Resp 18  Ht 5\' 8"  (1.727 m)  Wt 91.173 kg (201 lb)  BMI 30.56 kg/m2  SpO2 100%      FHT:  FHR: 130 bpm, variability: moderate,  accelerations:  Present,  decelerations:  Absent UC:   irregular, every 2-7 minutes SVE:   Dilation: 3 Effacement (%): 60 Station: -3 Exam by:: k. Darrnell Mangiaracina, md  Labs: Lab Results  Component Value Date   WBC 8.8 09/19/2011   HGB 12.6 09/19/2011   HCT 36.5 09/19/2011   MCV 92.6 09/19/2011   PLT 269 09/19/2011    Assessment / Plan: SROM and now with induction of labor with pitocin. Hx of LTCS with successful VBAC.   Labor: Progressing on Pitocin, will continue to increase then AROM after abx for GBS Preeclampsia:  na Fetal Wellbeing:  Category I and GBS + getting PCN Pain Control:  stadol I/D:  n/a Anticipated MOD:  NSVD  Rulon Abide 09/20/2011, 3:14 AM

## 2011-09-21 NOTE — Progress Notes (Signed)
I have seen and examined patient and agree with above. Pt reports bleeding slightly heavier than period. Will monitor pad counts. Napoleon Form, MD

## 2011-09-21 NOTE — Progress Notes (Signed)
Post Partum Day 1 Subjective: Patient doing well overnight.  Still having moderate pain throughout night 6/10.  Gets moderate relief from Percocet.  Pain 2/10 at time of interview.  Still having period amount of bleeding.  Has been getting good PO intake.  Urinating without difficulty.  Positive flatus but no bowel movements at present time.  Ambulating well in room.    Baby is bottle feeding with moderate success.  Plan for outpatient circumcision.  Depo for contraception    Objective: Blood pressure 118/75, pulse 68, temperature 97.6 F (36.4 C), temperature source Oral, resp. rate 18, height 5\' 8"  (1.727 m), weight 91.173 kg (201 lb), SpO2 99.00%, unknown if currently breastfeeding.  Physical Exam:  General: alert, cooperative, appears stated age and no distress  Heart: RRR, S1/S2 distinct without murmur; no rubs, clicks or gallops  Lungs: CTAB  Lochia: appropriate  Uterine Fundus: firm  DVT Evaluation: No evidence of DVT seen on physical exam. Negative Homan's sign. No cords or calf tenderness. No significant calf/ankle edema.   Basename 09/19/11 2150  HGB 12.6  HCT 36.5    Assessment/Plan: Plan for discharge tomorrow   LOS: 2 days   [name] 09/21/2011, 7:20 AM

## 2011-09-21 NOTE — Anesthesia Postprocedure Evaluation (Signed)
  Anesthesia Post-op Note  Patient: [name]  Procedure(s) Performed: Lumbar Epidural for L&D  Patient Location: Mother/Baby  Anesthesia Type: Epidural  Level of Consciousness: awake, alert  and oriented  Airway and Oxygen Therapy: Patient Spontanous Breathing  Post-op Pain: none  Post-op Assessment: Post-op Vital signs reviewed, Patient's Cardiovascular Status Stable, Respiratory Function Stable, Patent Airway, No signs of Nausea or vomiting, Pain level controlled, No headache, No backache, No residual numbness and No residual motor weakness  Post-op Vital Signs: Reviewed and stable  Complications: No apparent anesthesia complications

## 2011-09-22 MED ORDER — NORGESTIMATE-ETH ESTRADIOL 0.25-35 MG-MCG PO TABS: 1.0000 | ORAL_TABLET | Freq: Every day | ORAL | Status: DC

## 2011-09-22 MED ORDER — SENNOSIDES-DOCUSATE SODIUM 8.6-50 MG PO TABS: 2.0000 | ORAL_TABLET | Freq: Every day | ORAL | Status: DC

## 2011-09-22 MED ORDER — IBUPROFEN 600 MG PO TABS: 600.0000 mg | ORAL_TABLET | Freq: Four times a day (QID) | ORAL | Status: DC

## 2011-09-22 NOTE — Addendum Note (Signed)
Addendum  created 09/22/11 0857 by Samantha Ragen A Gerrie Castiglia, CRNA   Modules edited:Charges VN, Notes Section    

## 2011-09-22 NOTE — Anesthesia Postprocedure Evaluation (Signed)
  Anesthesia Post Note  Patient: [name]  Procedure(s) Performed: * No procedures listed *  Anesthesia type: Epidural  Patient location: Mother/Baby  Post pain: Pain level controlled  Post assessment: Post-op Vital signs reviewed  Last Vitals:  Filed Vitals:   09/22/11 0635  BP: 109/72  Pulse: 66  Temp: 36.6 C  Resp: 18    Post vital signs: Reviewed  Level of consciousness:alert  Complications: No apparent anesthesia complications

## 2011-09-22 NOTE — Discharge Summary (Signed)
Obstetric Discharge Summary Reason for Admission: onset of labor Prenatal Procedures: ultrasound Intrapartum Procedures: spontaneous vaginal delivery and GBS prophylaxis Postpartum Procedures: none Complications-Operative and Postpartum: none Hemoglobin  Date Value Range Status  09/19/2011 12.6  12.0 - 15.0 g/dL Final     HCT  Date Value Range Status  09/19/2011 36.5  36.0 - 46.0 % Final    Physical Exam:  General: alert, cooperative and no distress Lochia: appropriate Uterine Fundus: firm Incision: n/a DVT Evaluation: No evidence of DVT seen on physical exam.  Discharge Diagnoses: Term Pregnancy-delivered  Discharge Information: Date: 09/22/2011 Activity: pelvic rest Diet: routine Medications: PNV, Ibuprofen and Colace Condition: stable Instructions: refer to practice specific booklet Discharge to: home   Newborn Data: Live born female  Birth Weight: 6 lb 10.4 oz (3015 g) APGAR: 9, 9  Home with mother.  [name] 09/22/2011, 7:46 AM Evaluation and management procedures were performed by Resident physician under my supervision/collaboration. Chart reviewed, patient examined by me and I agree with management and plan.

## 2011-09-22 NOTE — Addendum Note (Signed)
Addendum  created 09/22/11 0857 by Algis Greenhouse, CRNA   Modules edited:Charges VN, Notes Section

## 2011-09-23 ENCOUNTER — Encounter: Payer: Medicaid Other | Admitting: Obstetrics & Gynecology

## 2011-10-13 ENCOUNTER — Ambulatory Visit: Payer: Medicaid Other | Admitting: Obstetrics and Gynecology

## 2011-11-08 ENCOUNTER — Ambulatory Visit: Payer: Medicaid Other | Admitting: Family Medicine

## 2013-07-28 ENCOUNTER — Encounter (HOSPITAL_COMMUNITY): Payer: Self-pay | Admitting: Emergency Medicine

## 2013-07-28 ENCOUNTER — Emergency Department (HOSPITAL_COMMUNITY)
Admission: EM | Admit: 2013-07-28 | Discharge: 2013-07-28 | Disposition: A | Discharge status: Home / Self Care | Payer: Medicaid Other | Attending: Emergency Medicine | Admitting: Emergency Medicine

## 2013-07-28 DIAGNOSIS — F17200 Nicotine dependence, unspecified, uncomplicated: Secondary | ICD-10-CM | POA: Insufficient documentation

## 2013-07-28 DIAGNOSIS — R51 Headache: Secondary | ICD-10-CM | POA: Insufficient documentation

## 2013-07-28 DIAGNOSIS — R519 Headache, unspecified: Secondary | ICD-10-CM

## 2013-07-28 DIAGNOSIS — H53149 Visual discomfort, unspecified: Secondary | ICD-10-CM | POA: Insufficient documentation

## 2013-07-28 DIAGNOSIS — Z87440 Personal history of urinary (tract) infections: Secondary | ICD-10-CM | POA: Insufficient documentation

## 2013-07-28 DIAGNOSIS — R112 Nausea with vomiting, unspecified: Secondary | ICD-10-CM | POA: Insufficient documentation

## 2013-07-28 DIAGNOSIS — H5710 Ocular pain, unspecified eye: Secondary | ICD-10-CM | POA: Insufficient documentation

## 2013-07-28 DIAGNOSIS — Z8619 Personal history of other infectious and parasitic diseases: Secondary | ICD-10-CM | POA: Insufficient documentation

## 2013-07-28 MED ORDER — ONDANSETRON 4 MG PO TBDP
8.0000 mg | ORAL_TABLET | Freq: Once | ORAL | Status: AC
  Administered 2013-07-28: 8 mg via ORAL
  Filled 2013-07-28: qty 2

## 2013-07-28 MED ORDER — KETOROLAC TROMETHAMINE 30 MG/ML IJ SOLN
30.0000 mg | Freq: Once | INTRAMUSCULAR | Status: AC
  Administered 2013-07-28: 30 mg via INTRAVENOUS
  Filled 2013-07-28: qty 1

## 2013-07-28 MED ORDER — ACETAMINOPHEN 325 MG PO TABS
650.0000 mg | ORAL_TABLET | Freq: Once | ORAL | Status: AC
  Administered 2013-07-28: 650 mg via ORAL
  Filled 2013-07-28: qty 2

## 2013-07-28 MED ORDER — METOCLOPRAMIDE HCL 5 MG/ML IJ SOLN
10.0000 mg | INTRAMUSCULAR | Status: AC
  Administered 2013-07-28: 10 mg via INTRAVENOUS
  Filled 2013-07-28: qty 2

## 2013-07-28 MED ORDER — OXYCODONE-ACETAMINOPHEN 5-325 MG PO TABS
1.0000 | ORAL_TABLET | Freq: Once | ORAL | Status: AC
  Administered 2013-07-28: 1 via ORAL
  Filled 2013-07-28 (×2): qty 1

## 2013-07-28 MED ORDER — DIPHENHYDRAMINE HCL 50 MG/ML IJ SOLN
25.0000 mg | Freq: Once | INTRAMUSCULAR | Status: AC
  Administered 2013-07-28: 25 mg via INTRAVENOUS
  Filled 2013-07-28: qty 1

## 2013-07-28 MED ORDER — SODIUM CHLORIDE 0.9 % IV BOLUS (SEPSIS)
1000.0000 mL | Freq: Once | INTRAVENOUS | Status: AC
  Administered 2013-07-28: 1000 mL via INTRAVENOUS

## 2013-07-28 NOTE — ED Provider Notes (Signed)
CSN: [id number]     Arrival date & time 07/28/13  1600 History   First MD Initiated Contact with Patient 07/28/13 2016     Chief Complaint  Patient presents with  . Headache  . Nausea     (Consider location/radiation/quality/duration/timing/severity/associated sxs/prior Treatment) HPI Comments: Patient is a 28 year old female with no significant past medical history who presents to the emergency department for a frontal headache x2 days. Patient states the headache has been intermittent since onset. Patient has not taken anything for symptoms. She states that this afternoon, her symptoms became associated with nausea, a few episodes of nonbloody/nonbilious emesis, high pressure, and photophobia. Patient believes that her symptoms may be stress related as her stress has increased as of late and her bili sleep has decreased. Patient denies associated fever, vision loss, tinnitus or hearing loss, difficulty speaking or swallowing, facial drooping, neck pain, chest pain, shortness of breath, syncope, numbness/tingling, and extremity weakness. Patient denies a history of similar headaches as well as thunderclap onset. She denies concern for pregnancy; LMP 07/27/2013  Patient is a 28 y.o. female presenting with headaches. The history is provided by the patient. No language interpreter was used.  Headache Associated symptoms: eye pain, nausea, photophobia and vomiting     Past Medical History  Diagnosis Date  . Urinary tract infection   . Preterm labor   . Trichomonas    Past Surgical History  Procedure Laterality Date  . Cesarean section    . Induced abortion     Family History  Problem Relation Age of Onset  . Other Neg Hx    History  Substance Use Topics  . Smoking status: Current Every Day Smoker -- 0.25 packs/day for 7 years    Types: Cigarettes  . Smokeless tobacco: Never Used  . Alcohol Use: No   OB History   Grav Para Term Preterm Abortions TAB SAB Ect Mult Living   4 3 2 1  1 1  0 0 0 3      Review of Systems  Eyes: Positive for photophobia and pain.  Gastrointestinal: Positive for nausea and vomiting.  Neurological: Positive for headaches.  All other systems reviewed and are negative.     Allergies  Review of patient's allergies indicates no known allergies.  Home Medications   Prior to Admission medications   Not on File   BP 119/69  Pulse 70  Temp(Src) 98.2 F (36.8 C) (Oral)  Resp 18  SpO2 100%  LMP 07/25/2013  Physical Exam  Nursing note and vitals reviewed. Constitutional: She is oriented to person, place, and time. She appears well-developed and well-nourished. No distress.  HENT:  Head: Normocephalic and atraumatic.  Mouth/Throat: Oropharynx is clear and moist. No oropharyngeal exudate.  Eyes: Conjunctivae and EOM are normal. Pupils are equal, round, and reactive to light. No scleral icterus.  EOMs normal without nystagmus.  Neck: Normal range of motion. Neck supple.  No nuchal rigidity or meningismus.  Cardiovascular: Normal rate, regular rhythm and normal heart sounds.   Pulmonary/Chest: Effort normal. No respiratory distress. She has no wheezes.  Chest expansion symmetric  Musculoskeletal: Normal range of motion.  Neurological: She is alert and oriented to person, place, and time. She has normal reflexes. No cranial nerve deficit. She exhibits normal muscle tone. Coordination normal.  GCS 15. Speech is goal oriented. No cranial nerve deficits appreciated; symmetric eyebrow raise, no facial drooping, tongue midline. Finger to nose intact. No pronator drift. Equal grip strength bilaterally with normal strength against resistance  in all extremities. Patient moves extremities without ataxia. DTRs normal and symmetric.  Skin: Skin is warm and dry. No rash noted. She is not diaphoretic. No erythema. No pallor.  Psychiatric: She has a normal mood and affect. Her behavior is normal.    ED Course  Procedures (including critical care  time) Labs Review Labs Reviewed - No data to display  Imaging Review No results found.   EKG Interpretation None      MDM   Final diagnoses:  Headache, unspecified headache type    Patient presents for headache. Headache without thunderclap onset. No head trauma PTA. No associated fever or neck stiffness. No nuchal rigidity or meningismus on exam. Neurologic exam nonfocal and symptoms improved over ED course with migraine cocktail. Patient states she feels comfortable managing symptoms further at home. Doubt emergent intracranial process or meningitis. Patient stable and appropriate for discharge with instruction followup with primary care provider. Return precautions provided and patient agreeable to plan with no unaddressed concerns.   Filed Vitals:   07/28/13 2033 07/28/13 2100 07/28/13 2200 07/28/13 2230  BP: 123/71 119/69 106/67 109/60  Pulse: 71 70 73 72  Temp:      TempSrc:      Resp: 17 18 18 15   SpO2: 100% 100% 100% 100%      Antony Madura, PA-C 07/28/13 2245  Antony Madura, PA-C 07/28/13 2245

## 2013-07-28 NOTE — ED Notes (Signed)
To ED for eval of HA/sinus pressure that started 3 days ago. Pt tearful and states she thinks it's stress. Denies SI, just states she is really stressed out. Denies wanting pain meds in triage. Cooperative.

## 2013-07-28 NOTE — Discharge Instructions (Signed)
Recommend home management with 600 mg ibuprofen every 6 hours or Excedrin Migraine. Also recommend you get at least 8 hours of sleep this evening. Drink plenty of water. Followup with a primary care provider.  Headaches, Frequently Asked Questions MIGRAINE HEADACHES Q: What is migraine? What causes it? How can I treat it? A: Generally, migraine headaches begin as a dull ache. Then they develop into a constant, throbbing, and pulsating pain. You may experience pain at the temples. You may experience pain at the front or back of one or both sides of the head. The pain is usually accompanied by a combination of:  Nausea.  Vomiting.  Sensitivity to light and noise. Some people (about 15%) experience an aura (see below) before an attack. The cause of migraine is believed to be chemical reactions in the brain. Treatment for migraine may include over-the-counter or prescription medications. It may also include self-help techniques. These include relaxation training and biofeedback.  Q: What is an aura? A: About 15% of people with migraine get an "aura". This is a sign of neurological symptoms that occur before a migraine headache. You may see wavy or jagged lines, dots, or flashing lights. You might experience tunnel vision or blind spots in one or both eyes. The aura can include visual or auditory hallucinations (something imagined). It may include disruptions in smell (such as strange odors), taste or touch. Other symptoms include:  Numbness.  A "pins and needles" sensation.  Difficulty in recalling or speaking the correct word. These neurological events may last as long as 60 minutes. These symptoms will fade as the headache begins. Q: What is a trigger? A: Certain physical or environmental factors can lead to or "trigger" a migraine. These include:  Foods.  Hormonal changes.  Weather.  Stress. It is important to remember that triggers are different for everyone. To help prevent migraine  attacks, you need to figure out which triggers affect you. Keep a headache diary. This is a good way to track triggers. The diary will help you talk to your healthcare professional about your condition. Q: Does weather affect migraines? A: Bright sunshine, hot, humid conditions, and drastic changes in barometric pressure may lead to, or "trigger," a migraine attack in some people. But studies have shown that weather does not act as a trigger for everyone with migraines. Q: What is the link between migraine and hormones? A: Hormones start and regulate many of your body's functions. Hormones keep your body in balance within a constantly changing environment. The levels of hormones in your body are unbalanced at times. Examples are during menstruation, pregnancy, or menopause. That can lead to a migraine attack. In fact, about three quarters of all women with migraine report that their attacks are related to the menstrual cycle.  Q: Is there an increased risk of stroke for migraine sufferers? A: The likelihood of a migraine attack causing a stroke is very remote. That is not to say that migraine sufferers cannot have a stroke associated with their migraines. In persons under age 30, the most common associated factor for stroke is migraine headache. But over the course of a person's normal life span, the occurrence of migraine headache may actually be associated with a reduced risk of dying from cerebrovascular disease due to stroke.  Q: What are acute medications for migraine? A: Acute medications are used to treat the pain of the headache after it has started. Examples over-the-counter medications, NSAIDs, ergots, and triptans.  Q: What are the triptans? A:  Triptans are the newest class of abortive medications. They are specifically targeted to treat migraine. Triptans are vasoconstrictors. They moderate some chemical reactions in the brain. The triptans work on receptors in your brain. Triptans help to  restore the balance of a neurotransmitter called serotonin. Fluctuations in levels of serotonin are thought to be a main cause of migraine.  Q: Are over-the-counter medications for migraine effective? A: Over-the-counter, or "OTC," medications may be effective in relieving mild to moderate pain and associated symptoms of migraine. But you should see your caregiver before beginning any treatment regimen for migraine.  Q: What are preventive medications for migraine? A: Preventive medications for migraine are sometimes referred to as "prophylactic" treatments. They are used to reduce the frequency, severity, and length of migraine attacks. Examples of preventive medications include antiepileptic medications, antidepressants, beta-blockers, calcium channel blockers, and NSAIDs (nonsteroidal anti-inflammatory drugs). Q: Why are anticonvulsants used to treat migraine? A: During the past few years, there has been an increased interest in antiepileptic drugs for the prevention of migraine. They are sometimes referred to as "anticonvulsants". Both epilepsy and migraine may be caused by similar reactions in the brain.  Q: Why are antidepressants used to treat migraine? A: Antidepressants are typically used to treat people with depression. They may reduce migraine frequency by regulating chemical levels, such as serotonin, in the brain.  Q: What alternative therapies are used to treat migraine? A: The term "alternative therapies" is often used to describe treatments considered outside the scope of conventional Western medicine. Examples of alternative therapy include acupuncture, acupressure, and yoga. Another common alternative treatment is herbal therapy. Some herbs are believed to relieve headache pain. Always discuss alternative therapies with your caregiver before proceeding. Some herbal products contain arsenic and other toxins. TENSION HEADACHES Q: What is a tension-type headache? What causes it? How can I  treat it? A: Tension-type headaches occur randomly. They are often the result of temporary stress, anxiety, fatigue, or anger. Symptoms include soreness in your temples, a tightening band-like sensation around your head (a "vice-like" ache). Symptoms can also include a pulling feeling, pressure sensations, and contracting head and neck muscles. The headache begins in your forehead, temples, or the back of your head and neck. Treatment for tension-type headache may include over-the-counter or prescription medications. Treatment may also include self-help techniques such as relaxation training and biofeedback. CLUSTER HEADACHES Q: What is a cluster headache? What causes it? How can I treat it? A: Cluster headache gets its name because the attacks come in groups. The pain arrives with little, if any, warning. It is usually on one side of the head. A tearing or bloodshot eye and a runny nose on the same side of the headache may also accompany the pain. Cluster headaches are believed to be caused by chemical reactions in the brain. They have been described as the most severe and intense of any headache type. Treatment for cluster headache includes prescription medication and oxygen. SINUS HEADACHES Q: What is a sinus headache? What causes it? How can I treat it? A: When a cavity in the bones of the face and skull (a sinus) becomes inflamed, the inflammation will cause localized pain. This condition is usually the result of an allergic reaction, a tumor, or an infection. If your headache is caused by a sinus blockage, such as an infection, you will probably have a fever. An x-ray will confirm a sinus blockage. Your caregiver's treatment might include antibiotics for the infection, as well as antihistamines or decongestants.  REBOUND HEADACHES Q: What is a rebound headache? What causes it? How can I treat it? A: A pattern of taking acute headache medications too often can lead to a condition known as "rebound  headache." A pattern of taking too much headache medication includes taking it more than 2 days per week or in excessive amounts. That means more than the label or a caregiver advises. With rebound headaches, your medications not only stop relieving pain, they actually begin to cause headaches. Doctors treat rebound headache by tapering the medication that is being overused. Sometimes your caregiver will gradually substitute a different type of treatment or medication. Stopping may be a challenge. Regularly overusing a medication increases the potential for serious side effects. Consult a caregiver if you regularly use headache medications more than 2 days per week or more than the label advises. ADDITIONAL QUESTIONS AND ANSWERS Q: What is biofeedback? A: Biofeedback is a self-help treatment. Biofeedback uses special equipment to monitor your body's involuntary physical responses. Biofeedback monitors:  Breathing.  Pulse.  Heart rate.  Temperature.  Muscle tension.  Brain activity. Biofeedback helps you refine and perfect your relaxation exercises. You learn to control the physical responses that are related to stress. Once the technique has been mastered, you do not need the equipment any more. Q: Are headaches hereditary? A: Four out of five (80%) of people that suffer report a family history of migraine. Scientists are not sure if this is genetic or a family predisposition. Despite the uncertainty, a child has a 50% chance of having migraine if one parent suffers. The child has a 75% chance if both parents suffer.  Q: Can children get headaches? A: By the time they reach high school, most young people have experienced some type of headache. Many safe and effective approaches or medications can prevent a headache from occurring or stop it after it has begun.  Q: What type of doctor should I see to diagnose and treat my headache? A: Start with your primary caregiver. Discuss his or her  experience and approach to headaches. Discuss methods of classification, diagnosis, and treatment. Your caregiver may decide to recommend you to a headache specialist, depending upon your symptoms or other physical conditions. Having diabetes, allergies, etc., may require a more comprehensive and inclusive approach to your headache. The National Head[employer] Foundation will provide, upon request, a list of [facility] physician members in your state. Document Released: 03/13/2003 Document Revised: 03/15/2011 Document Reviewed: 08/21/2007 [facility] Patient Information 2015 [place], [place]. This information is not intended to replace advice given to you by your health care provider. Make sure you discuss any questions you have with your health care provider.

## 2013-08-04 NOTE — ED Provider Notes (Signed)
I saw and evaluated the patient, reviewed the resident's note and I agree with the findings and plan.   EKG Interpretation None        Treena Cosman, MD 08/04/13 2001 

## 2013-11-05 ENCOUNTER — Encounter (HOSPITAL_COMMUNITY): Payer: Self-pay | Admitting: Emergency Medicine

## 2014-01-04 NOTE — L&D Delivery Note (Signed)
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 Light meconium stained fluids were observed prior to delivery.  APGAR: 8, 9; weight pending  .   Placenta status: intact with trailing membranes. Membranes were manually extracted from uterus. Placenta sent to pathology. Methergine protocol initiated. Delayed cord clamping for one minute. Cord: 3 vessels with the following complications: none.  Cord pH: N/A  Anesthesia: Epidural  Episiotomy: None Lacerations: None Suture Repair: none Est. Blood Loss (mL):  350  Mom to postpartum.  Baby to NICU.  De HollingsheadCatherine L Elysia Grand 07/20/2014, 2:01 PM

## 2014-03-02 ENCOUNTER — Encounter (HOSPITAL_COMMUNITY): Payer: Self-pay | Admitting: *Deleted

## 2014-03-02 ENCOUNTER — Emergency Department (HOSPITAL_COMMUNITY)
Admission: EM | Admit: 2014-03-02 | Discharge: 2014-03-02 | Disposition: A | Discharge status: Home / Self Care | Payer: Medicaid Other | Attending: Emergency Medicine | Admitting: Emergency Medicine

## 2014-03-02 DIAGNOSIS — R5383 Other fatigue: Secondary | ICD-10-CM | POA: Insufficient documentation

## 2014-03-02 DIAGNOSIS — Z8619 Personal history of other infectious and parasitic diseases: Secondary | ICD-10-CM | POA: Insufficient documentation

## 2014-03-02 DIAGNOSIS — Z3490 Encounter for supervision of normal pregnancy, unspecified, unspecified trimester: Secondary | ICD-10-CM

## 2014-03-02 DIAGNOSIS — Z3A00 Weeks of gestation of pregnancy not specified: Secondary | ICD-10-CM | POA: Insufficient documentation

## 2014-03-02 DIAGNOSIS — R110 Nausea: Secondary | ICD-10-CM | POA: Insufficient documentation

## 2014-03-02 DIAGNOSIS — O99331 Smoking (tobacco) complicating pregnancy, first trimester: Secondary | ICD-10-CM | POA: Insufficient documentation

## 2014-03-02 DIAGNOSIS — O9989 Other specified diseases and conditions complicating pregnancy, childbirth and the puerperium: Secondary | ICD-10-CM | POA: Insufficient documentation

## 2014-03-02 DIAGNOSIS — R51 Headache: Secondary | ICD-10-CM | POA: Insufficient documentation

## 2014-03-02 DIAGNOSIS — Z87440 Personal history of urinary (tract) infections: Secondary | ICD-10-CM | POA: Insufficient documentation

## 2014-03-02 DIAGNOSIS — F17210 Nicotine dependence, cigarettes, uncomplicated: Secondary | ICD-10-CM | POA: Insufficient documentation

## 2014-03-02 LAB — URINALYSIS, ROUTINE W REFLEX MICROSCOPIC
Bilirubin Urine: NEGATIVE
GLUCOSE, UA: NEGATIVE mg/dL
HGB URINE DIPSTICK: NEGATIVE
KETONES UR: NEGATIVE mg/dL
NITRITE: NEGATIVE
PROTEIN: NEGATIVE mg/dL
SPECIFIC GRAVITY, URINE: 1.022 (ref 1.005–1.030)
UROBILINOGEN UA: 1 mg/dL (ref 0.0–1.0)
pH: 7.5 (ref 5.0–8.0)

## 2014-03-02 LAB — URINE MICROSCOPIC-ADD ON

## 2014-03-02 LAB — POC URINE PREG, ED: PREG TEST UR: POSITIVE — AB

## 2014-03-02 MED ORDER — PRENATAL COMPLETE 14-0.4 MG PO TABS: 1.0000 | ORAL_TABLET | Freq: Every day | ORAL | Status: AC

## 2014-03-02 NOTE — ED Notes (Signed)
Pt reports having nausea, fatigue, headache x 5 days. lmp 1.5 months ago. No acute distress noted at triage.

## 2014-03-02 NOTE — Discharge Instructions (Signed)
Read the information below.  Use the prescribed medication as directed.  Please discuss all new medications with your pharmacist.  You may return to the Emergency Department at any time for worsening condition or any new symptoms that concern you.  If you develop fevers, uncontrolled pain, uncontrolled vomiting, or difficulty breathing or swallowing return to the ER for a recheck.      First Trimester of Pregnancy The first trimester of pregnancy is from week 1 until the end of week 12 (months 1 through 3). During this time, your baby will begin to develop inside you. At 6-8 weeks, the eyes and face are formed, and the heartbeat can be seen on ultrasound. At the end of 12 weeks, all the baby's organs are formed. Prenatal care is all the medical care you receive before the birth of your baby. Make sure you get good prenatal care and follow all of your doctor's instructions. HOME CARE  Medicines  Take medicine only as told by your doctor. Some medicines are safe and some are not during pregnancy.  Take your prenatal vitamins as told by your doctor.  Take medicine that helps you poop (stool softener) as needed if your doctor says it is okay. Diet  Eat regular, healthy meals.  Your doctor will tell you the amount of weight gain that is right for you.  Avoid raw meat and uncooked cheese.  If you feel sick to your stomach (nauseous) or throw up (vomit):  Eat 4 or 5 small meals a day instead of 3 large meals.  Try eating a few soda crackers.  Drink liquids between meals instead of during meals.  If you have a hard time pooping (constipation):  Eat high-fiber foods like fresh vegetables, fruit, and whole grains.  Drink enough fluids to keep your pee (urine) clear or pale yellow. Activity and Exercise  Exercise only as told by your doctor. Stop exercising if you have cramps or pain in your lower belly (abdomen) or low back.  Try to avoid standing for long periods of time. Move your legs  often if you must stand in one place for a long time.  Avoid heavy lifting.  Wear low-heeled shoes. Sit and stand up straight.  You can have sex unless your doctor tells you not to. Relief of Pain or Discomfort  Wear a good support bra if your breasts are sore.  Take warm water baths (sitz baths) to soothe pain or discomfort caused by hemorrhoids. Use hemorrhoid cream if your doctor says it is okay.  Rest with your legs raised if you have leg cramps or low back pain.  Wear support hose if you have puffy, bulging veins (varicose veins) in your legs. Raise (elevate) your feet for 15 minutes, 3-4 times a day. Limit salt in your diet. Prenatal Care  Schedule your prenatal visits by the twelfth week of pregnancy.  Write down your questions. Take them to your prenatal visits.  Keep all your prenatal visits as told by your doctor. Safety  Wear your seat belt at all times when driving.  Make a list of emergency phone numbers. The list should include numbers for family, friends, the hospital, and police and fire departments. General Tips  Ask your doctor for a referral to a local prenatal class. Begin classes no later than at the start of month 6 of your pregnancy.  Ask for help if you need counseling or help with nutrition. Your doctor can give you advice or tell you where to  go for help.  Do not use hot tubs, steam rooms, or saunas.  Do not douche or use tampons or scented sanitary pads.  Do not cross your legs for long periods of time.  Avoid litter boxes and soil used by cats.  Avoid all smoking, herbs, and alcohol. Avoid drugs not approved by your doctor.  Visit your dentist. At home, brush your teeth with a soft toothbrush. Be gentle when you floss. GET HELP IF:  You are dizzy.  You have mild cramps or pressure in your lower belly.  You have a nagging pain in your belly area.  You continue to feel sick to your stomach, throw up, or have watery poop (diarrhea).  You  have a bad smelling fluid coming from your vagina.  You have pain with peeing (urination).  You have increased puffiness (swelling) in your face, hands, legs, or ankles. GET HELP RIGHT AWAY IF:   You have a fever.  You are leaking fluid from your vagina.  You have spotting or bleeding from your vagina.  You have very bad belly cramping or pain.  You gain or lose weight rapidly.  You throw up blood. It may look like coffee grounds.  You are around people who have [place] measles, fifth disease, or chickenpox.  You have a very bad headache.  You have shortness of breath.  You have any kind of trauma, such as from a fall or a car accident. Document Released: 06/09/2007 Document Revised: 05/07/2013 Document Reviewed: 10/31/2012 [facility] Patient Information 2015 [place], [place]. This information is not intended to replace advice given to you by your health care provider. Make sure you discuss any questions you have with your health care provider.  Nausea and Vomiting Nausea means you feel sick to your stomach. Throwing up (vomiting) is a reflex where stomach contents come out of your mouth. HOME CARE   Take medicine as told by your doctor.  Do not force yourself to eat. However, you do need to drink fluids.  If you feel like eating, eat a normal diet as told by your doctor.  Eat rice, wheat, potatoes, bread, lean meats, yogurt, fruits, and vegetables.  Avoid high-fat foods.  Drink enough fluids to keep your pee (urine) clear or pale yellow.  Ask your doctor how to replace body fluid losses (rehydrate). Signs of body fluid loss (dehydration) include:  Feeling very thirsty.  Dry lips and mouth.  Feeling dizzy.  Dark pee.  Peeing less than normal.  Feeling confused.  Fast breathing or heart rate. GET HELP RIGHT AWAY IF:   You have blood in your throw up.  You have black or bloody poop (stool).  You have a bad headache or stiff neck.  You feel  confused.  You have bad belly (abdominal) pain.  You have chest pain or trouble breathing.  You do not pee at least once every 8 hours.  You have cold, clammy skin.  You keep throwing up after 24 to 48 hours.  You have a fever. MAKE SURE YOU:   Understand these instructions.  Will watch your condition.  Will get help right away if you are not doing well or get worse. Document Released: 06/09/2007 Document Revised: 03/15/2011 Document Reviewed: 05/22/2010 [facility] Patient Information 2015 [place], [place]. This information is not intended to replace advice given to you by your health care provider. Make sure you discuss any questions you have with your health care provider.

## 2014-03-02 NOTE — ED Notes (Signed)
Patient reports two previous pregnancies and two living children. Patient denies any pain, but states, "I mainly get dizzy at work."

## 2014-03-02 NOTE — ED Notes (Signed)
PA at the bedside.

## 2014-03-02 NOTE — ED Provider Notes (Signed)
CSN: [id number]     Arrival date & time 03/02/14  1434 History   First MD Initiated Contact with Patient 03/02/14 1851     Chief Complaint  Patient presents with  . Fatigue  . Headache  . Nausea     (Consider location/radiation/quality/duration/timing/severity/associated sxs/prior Treatment) HPI   Pt states she has had intermittent nausea with three episodes of vomiting this week, and occasional lightheadedness and occasional mild headache.  She is currently feeling well.  States she has not had a period in 2 months and thinks she might be pregnant.  She is G2P2002.  Gyn is Dr Francoise Ceo. Denies abdominal pain, abnormal vaginal discharge, any vaginal bleeding.  Denies dysuria, urinary frequency or urgency. Denies change in her bowel habits.  Denies peripheral edema.  She states she is eating and drinking well.  She had no complications with any of her other pregnancies and has two healthy children.    Past Medical History  Diagnosis Date  . Urinary tract infection   . Preterm labor   . Trichomonas    Past Surgical History  Procedure Laterality Date  . Cesarean section    . Induced abortion     Family History  Problem Relation Age of Onset  . Other Neg Hx    History  Substance Use Topics  . Smoking status: Current Every Day Smoker -- 0.25 packs/day for 7 years    Types: Cigarettes  . Smokeless tobacco: Never Used  . Alcohol Use: No   OB History    Gravida Para Term Preterm AB TAB SAB Ectopic Multiple Living   0 0 0 3     Review of Systems  All other systems reviewed and are negative.     Allergies  Review of patient's allergies indicates no known allergies.  Home Medications   Prior to Admission medications   Medication Sig Start Date End Date Taking? Authorizing Provider  Prenatal Vit-Fe Fumarate-FA (PRENATAL COMPLETE) 14-0.4 MG TABS Take 1 tablet by mouth daily. 03/02/14   Trixie Dredge, PA-C   BP 109/61 mmHg  Pulse 78  Temp(Src) 98.3 F  (36.8 C) (Oral)  Resp 16  Ht  (1.727 m)  Wt 185 lb (83.915 kg)  BMI 28.14 kg/m2  SpO2 100%  LMP 01/04/2014 (Approximate) Physical Exam  Constitutional: She appears well-developed and well-nourished. No distress.  HENT:  Head: Normocephalic and atraumatic.  Neck: Neck supple.  Cardiovascular: Normal rate and regular rhythm.   Pulmonary/Chest: Effort normal and breath sounds normal. No respiratory distress. She has no wheezes. She has no rales.  Abdominal: Soft. She exhibits no distension and no mass. There is no tenderness. There is no rebound and no guarding.  Musculoskeletal: She exhibits no edema.  Neurological: She is alert.  Skin: She is not diaphoretic.  Nursing note and vitals reviewed.   ED Course  Procedures (including critical care time) Labs Review Labs Reviewed  URINALYSIS, ROUTINE W REFLEX MICROSCOPIC - Abnormal; Notable for the following:    APPearance CLOUDY (*)    Leukocytes, UA LARGE (*)    All other components within normal limits  URINE MICROSCOPIC-ADD ON - Abnormal; Notable for the following:    Squamous Epithelial / LPF MANY (*)    Bacteria, UA MANY (*)    All other components within normal limits  POC URINE PREG, ED - Abnormal; Notable for the following:    Preg Test, Ur POSITIVE (*)    All other components within  normal limits    Imaging Review No results found.   EKG Interpretation None      MDM   Final diagnoses:  Pregnant  Nausea    Afebrile, nontoxic patient with nausea/vomiting x 3 this week and occasional mild headache and mild lightheadedness that she feels is out of the ordinary for her.  She was feeling well while in the ED but came to be checked because she thought she might be pregnant.  Her pregnancy test is positive.  LMP approximately 2 months ago.  No abdominal pain, vaginal bleeding or abnormal discharge.  No systemic symptoms.  No prior complications with other pregnancies.   Pt has no urinary symptoms at all and UA  appears to be contaminated with epithelial cells.  I doubt UTI.  I advised pt to be rechecked if she notices any symptoms- and we discussed what symptoms to watch out for. D/C home with prenatal vitamins, obgyn follow up.  [facility], bleeding, or worsening symptoms.  Discussed result, findings, treatment, and follow up  with patient.  Pt given return precautions.  Pt verbalizes understanding and agrees with plan.         Trixie Dredgemily Nyree Yonker, PA-C 03/02/14 2342  Vanetta MuldersScott Zackowski, MD 03/03/14 [phone]

## 2014-07-14 ENCOUNTER — Inpatient Hospital Stay (HOSPITAL_COMMUNITY): Payer: Medicaid Other

## 2014-07-14 ENCOUNTER — Encounter (HOSPITAL_COMMUNITY): Payer: Self-pay | Admitting: Medical

## 2014-07-14 ENCOUNTER — Inpatient Hospital Stay (HOSPITAL_COMMUNITY)
Admission: AD | Admit: 2014-07-14 | Discharge: 2014-07-22 | DRG: 767 | Disposition: A | Discharge status: Home / Self Care | Payer: Medicaid Other | Source: Ambulatory Visit | Attending: Obstetrics & Gynecology | Admitting: Obstetrics & Gynecology

## 2014-07-14 DIAGNOSIS — O359XX0 Maternal care for (suspected) fetal abnormality and damage, unspecified, not applicable or unspecified: Secondary | ICD-10-CM | POA: Diagnosis present

## 2014-07-14 DIAGNOSIS — O34219 Maternal care for unspecified type scar from previous cesarean delivery: Secondary | ICD-10-CM | POA: Diagnosis present

## 2014-07-14 DIAGNOSIS — O0933 Supervision of pregnancy with insufficient antenatal care, third trimester: Secondary | ICD-10-CM | POA: Diagnosis not present

## 2014-07-14 DIAGNOSIS — O42913 Preterm premature rupture of membranes, unspecified as to length of time between rupture and onset of labor, third trimester: Secondary | ICD-10-CM | POA: Diagnosis present

## 2014-07-14 DIAGNOSIS — O99334 Smoking (tobacco) complicating childbirth: Secondary | ICD-10-CM | POA: Diagnosis present

## 2014-07-14 DIAGNOSIS — IMO0002 Reserved for concepts with insufficient information to code with codable children: Secondary | ICD-10-CM

## 2014-07-14 DIAGNOSIS — O09219 Supervision of pregnancy with history of pre-term labor, unspecified trimester: Secondary | ICD-10-CM

## 2014-07-14 DIAGNOSIS — Z0489 Encounter for examination and observation for other specified reasons: Secondary | ICD-10-CM

## 2014-07-14 DIAGNOSIS — A5901 Trichomonal vulvovaginitis: Secondary | ICD-10-CM | POA: Diagnosis present

## 2014-07-14 DIAGNOSIS — O9832 Other infections with a predominantly sexual mode of transmission complicating childbirth: Secondary | ICD-10-CM | POA: Diagnosis present

## 2014-07-14 DIAGNOSIS — O3421 Maternal care for scar from previous cesarean delivery: Secondary | ICD-10-CM | POA: Diagnosis present

## 2014-07-14 DIAGNOSIS — O23599 Infection of other part of genital tract in pregnancy, unspecified trimester: Secondary | ICD-10-CM

## 2014-07-14 DIAGNOSIS — F1410 Cocaine abuse, uncomplicated: Secondary | ICD-10-CM | POA: Diagnosis present

## 2014-07-14 DIAGNOSIS — O283 Abnormal ultrasonic finding on antenatal screening of mother: Secondary | ICD-10-CM

## 2014-07-14 DIAGNOSIS — O99324 Drug use complicating childbirth: Secondary | ICD-10-CM | POA: Diagnosis present

## 2014-07-14 DIAGNOSIS — O99323 Drug use complicating pregnancy, third trimester: Secondary | ICD-10-CM

## 2014-07-14 DIAGNOSIS — O99333 Smoking (tobacco) complicating pregnancy, third trimester: Secondary | ICD-10-CM | POA: Diagnosis present

## 2014-07-14 DIAGNOSIS — O42919 Preterm premature rupture of membranes, unspecified as to length of time between rupture and onset of labor, unspecified trimester: Secondary | ICD-10-CM | POA: Diagnosis present

## 2014-07-14 DIAGNOSIS — F17210 Nicotine dependence, cigarettes, uncomplicated: Secondary | ICD-10-CM | POA: Diagnosis present

## 2014-07-14 DIAGNOSIS — O9989 Other specified diseases and conditions complicating pregnancy, childbirth and the puerperium: Secondary | ICD-10-CM | POA: Diagnosis present

## 2014-07-14 DIAGNOSIS — Z3A31 31 weeks gestation of pregnancy: Secondary | ICD-10-CM | POA: Diagnosis present

## 2014-07-14 LAB — DIFFERENTIAL
BASOS PCT: 0 % (ref 0–1)
Basophils Absolute: 0 10*3/uL (ref 0.0–0.1)
Eosinophils Absolute: 0.1 10*3/uL (ref 0.0–0.7)
Eosinophils Relative: 2 % (ref 0–5)
Lymphocytes Relative: 29 % (ref 12–46)
Lymphs Abs: 2.4 10*3/uL (ref 0.7–4.0)
Monocytes Absolute: 0.5 10*3/uL (ref 0.1–1.0)
Monocytes Relative: 6 % (ref 3–12)
NEUTROS ABS: 5.1 10*3/uL (ref 1.7–7.7)
NEUTROS PCT: 63 % (ref 43–77)

## 2014-07-14 LAB — CBC
HCT: 33.8 % — ABNORMAL LOW (ref 36.0–46.0)
Hemoglobin: 11.8 g/dL — ABNORMAL LOW (ref 12.0–15.0)
MCH: 32.3 pg (ref 26.0–34.0)
MCHC: 34.9 g/dL (ref 30.0–36.0)
MCV: 92.6 fL (ref 78.0–100.0)
Platelets: 247 10*3/uL (ref 150–400)
RBC: 3.65 MIL/uL — ABNORMAL LOW (ref 3.87–5.11)
RDW: 12.6 % (ref 11.5–15.5)
WBC: 8.2 10*3/uL (ref 4.0–10.5)

## 2014-07-14 LAB — URINALYSIS, ROUTINE W REFLEX MICROSCOPIC
Bilirubin Urine: NEGATIVE
Glucose, UA: NEGATIVE mg/dL
Ketones, ur: NEGATIVE mg/dL
NITRITE: NEGATIVE
PH: 6 (ref 5.0–8.0)
Protein, ur: NEGATIVE mg/dL
Urobilinogen, UA: 0.2 mg/dL (ref 0.0–1.0)

## 2014-07-14 LAB — TYPE AND SCREEN
ABO/RH(D): O POS
Antibody Screen: NEGATIVE

## 2014-07-14 LAB — RAPID HIV SCREEN (HIV 1/2 AB+AG)
HIV 1/2 Antibodies: NONREACTIVE
HIV-1 P24 Antigen - HIV24: NONREACTIVE

## 2014-07-14 LAB — GROUP B STREP BY PCR: GROUP B STREP BY PCR: NEGATIVE

## 2014-07-14 LAB — RAPID URINE DRUG SCREEN, HOSP PERFORMED
Amphetamines: NOT DETECTED
Barbiturates: NOT DETECTED
Benzodiazepines: NOT DETECTED
COCAINE: POSITIVE — AB
OPIATES: NOT DETECTED
Tetrahydrocannabinol: NOT DETECTED

## 2014-07-14 LAB — URINE MICROSCOPIC-ADD ON

## 2014-07-14 LAB — AMNISURE RUPTURE OF MEMBRANE (ROM) NOT AT ARMC: Amnisure ROM: POSITIVE

## 2014-07-14 LAB — RPR: RPR: NONREACTIVE

## 2014-07-14 LAB — WET PREP, GENITAL
Clue Cells Wet Prep HPF POC: NONE SEEN
Yeast Wet Prep HPF POC: NONE SEEN

## 2014-07-14 LAB — OB RESULTS CONSOLE HIV ANTIBODY (ROUTINE TESTING): HIV: NONREACTIVE

## 2014-07-14 LAB — OB RESULTS CONSOLE GC/CHLAMYDIA: Gonorrhea: NEGATIVE

## 2014-07-14 LAB — OB RESULTS CONSOLE GBS: STREP GROUP B AG: NEGATIVE

## 2014-07-14 MED ORDER — DOCUSATE SODIUM 100 MG PO CAPS
100.0000 mg | ORAL_CAPSULE | Freq: Every day | ORAL | Status: DC
  Administered 2014-07-15 – 2014-07-20 (×6): 100 mg via ORAL
  Filled 2014-07-14 (×7): qty 1

## 2014-07-14 MED ORDER — BETAMETHASONE SOD PHOS & ACET 6 (3-3) MG/ML IJ SUSP
12.0000 mg | INTRAMUSCULAR | Status: AC
  Administered 2014-07-14 – 2014-07-15 (×2): 12 mg via INTRAMUSCULAR
  Filled 2014-07-14 (×2): qty 2

## 2014-07-14 MED ORDER — MAGNESIUM SULFATE 50 % IJ SOLN
2.0000 g/h | INTRAVENOUS | Status: DC
  Administered 2014-07-14 – 2014-07-15 (×2): 2 g/h via INTRAVENOUS
  Filled 2014-07-14 (×2): qty 80

## 2014-07-14 MED ORDER — ACETAMINOPHEN 325 MG PO TABS
650.0000 mg | ORAL_TABLET | ORAL | Status: DC | PRN
  Administered 2014-07-14: 650 mg via ORAL
  Filled 2014-07-14: qty 2

## 2014-07-14 MED ORDER — AMOXICILLIN 500 MG PO CAPS
500.0000 mg | ORAL_CAPSULE | Freq: Three times a day (TID) | ORAL | Status: DC
  Administered 2014-07-16 – 2014-07-20 (×13): 500 mg via ORAL
  Filled 2014-07-14 (×13): qty 1

## 2014-07-14 MED ORDER — LACTATED RINGERS IV SOLN
INTRAVENOUS | Status: DC
  Administered 2014-07-14 – 2014-07-19 (×8): via INTRAVENOUS

## 2014-07-14 MED ORDER — CALCIUM CARBONATE ANTACID 500 MG PO CHEW
2.0000 | CHEWABLE_TABLET | ORAL | Status: DC | PRN
  Filled 2014-07-14: qty 2

## 2014-07-14 MED ORDER — MAGNESIUM SULFATE BOLUS VIA INFUSION
4.0000 g | Freq: Once | INTRAVENOUS | Status: AC
  Administered 2014-07-14: 4 g via INTRAVENOUS
  Filled 2014-07-14: qty 500

## 2014-07-14 MED ORDER — METRONIDAZOLE 500 MG PO TABS
500.0000 mg | ORAL_TABLET | Freq: Two times a day (BID) | ORAL | Status: DC
  Administered 2014-07-14 – 2014-07-20 (×13): 500 mg via ORAL
  Filled 2014-07-14 (×13): qty 1

## 2014-07-14 MED ORDER — PRENATAL MULTIVITAMIN CH
1.0000 | ORAL_TABLET | Freq: Every day | ORAL | Status: DC
  Administered 2014-07-15 – 2014-07-19 (×6): 1 via ORAL
  Filled 2014-07-14 (×7): qty 1

## 2014-07-14 MED ORDER — SODIUM CHLORIDE 0.9 % IV SOLN
250.0000 mg | Freq: Four times a day (QID) | INTRAVENOUS | Status: AC
  Administered 2014-07-14 – 2014-07-16 (×8): 250 mg via INTRAVENOUS
  Filled 2014-07-14 (×8): qty 5

## 2014-07-14 MED ORDER — ERYTHROMYCIN BASE 250 MG PO TABS
250.0000 mg | ORAL_TABLET | Freq: Four times a day (QID) | ORAL | Status: DC
  Administered 2014-07-16 – 2014-07-20 (×17): 250 mg via ORAL
  Filled 2014-07-14 (×17): qty 1

## 2014-07-14 MED ORDER — ZOLPIDEM TARTRATE 5 MG PO TABS: 5.0000 mg | ORAL_TABLET | Freq: Every evening | ORAL | Status: DC | PRN

## 2014-07-14 MED ORDER — SODIUM CHLORIDE 0.9 % IV SOLN
2.0000 g | Freq: Four times a day (QID) | INTRAVENOUS | Status: AC
  Administered 2014-07-14 – 2014-07-16 (×8): 2 g via INTRAVENOUS
  Filled 2014-07-14 (×8): qty 2000

## 2014-07-14 NOTE — MAU Note (Signed)
Pt states lof since 0730 this am.

## 2014-07-14 NOTE — MAU Note (Signed)
Pt states her last LMP was at the end of November

## 2014-07-14 NOTE — H&P (Signed)
[name] is a 29 y.o. female presenting for Leaking fluid since this morning at 0730. Fluid was clear  It came out in several small gushes. Denies bleeding or contractions. Has history of C/S with first baby  For abruption, then VBAC x 2.  One baby born at 7935 weeks.   RN Note: Pt presents to MAU with complaints of leakage of fluid since 730 this morning. Reports no prenatal care because she was unsure if she was going to terminate pregnancy. Pt states she has not had an ultrasound at all with the pregnancy. Denies any vaginal bleeding          Maternal Medical History:  Reason for admission: Rupture of membranes.  Nausea.  Contractions: Onset was 1-2 hours ago.   Frequency: rare.   Perceived severity is mild.    Fetal activity: Perceived fetal activity is normal.   Last perceived fetal movement was within the past hour.    Prenatal complications: Preterm labor.   No bleeding or PIH.   Prenatal Complications - Diabetes: none. Not tested, no prenatal care    OB History    Gravida Para Term Preterm AB TAB SAB Ectopic Multiple Living   5 3 2 1 1 1  0 0 0 3     Past Medical History  Diagnosis Date  . Urinary tract infection   . Preterm labor   . Trichomonas    Past Surgical History  Procedure Laterality Date  . Cesarean section    . Induced abortion     Family History: family history is negative for Other. Social History:  reports that she has been smoking Cigarettes.  She has a 1.75 pack-year smoking history. She has never used smokeless tobacco. She reports that she does not drink alcohol or use illicit drugs.  Review of Systems  Constitutional: Negative for fever, chills and malaise/fatigue.  Eyes: Negative for blurred vision and double vision.  Cardiovascular: Negative for chest pain.  Gastrointestinal: Negative for nausea, vomiting, abdominal pain, diarrhea and constipation.  Genitourinary: Negative for dysuria, urgency, frequency and flank pain.   Musculoskeletal: Negative for myalgias, back pain and neck pain.  Neurological: Negative for dizziness, focal weakness, weakness and headaches.    Dilation: 3 Effacement (%): 70 Station: -3 Exam by:: Sharnetta Gielow CNM Blood pressure 126/74, pulse 114, temperature 97.6 F (36.4 C), resp. rate 18, last menstrual period 12/03/2013, unknown if currently breastfeeding. Maternal Exam:  Uterine Assessment: Contraction strength is mild.  Contraction frequency is rare.   Abdomen: Patient reports no abdominal tenderness. Surgical scars: low transverse.   Fundal height is 27.   Estimated fetal weight is 2.5.    Introitus: Normal vulva. Vulva is negative for lesion.  Vagina is positive for vaginal discharge (clear fluid).  Vagina is negative for ulcerations.  Ferning test: positive.  Nitrazine test: not done. Amniotic fluid character: clear.  Pelvis: adequate for delivery.   Cervix: Cervix evaluated by digital exam.     Fetal Exam Fetal Monitor Review: Mode: ultrasound.   Baseline rate: 135.  Variability: moderate (6-25 bpm).   Pattern: accelerations present and no decelerations.    Fetal State Assessment: Category I - tracings are normal.     Physical Exam  Constitutional: She is oriented to person, place, and time. She appears well-developed and well-nourished. No distress.  HENT:  Head: Normocephalic.  Cardiovascular: Normal rate and regular rhythm.  Exam reveals no gallop and no friction rub.   No murmur heard. Respiratory: Effort normal. No respiratory distress.  GI: Soft. She exhibits no distension and no mass. There is no tenderness. There is no rebound and no guarding.  Genitourinary: Vulva exhibits no lesion. Vaginal discharge (clear fluid) found.  Speculum exam: + pooling                             +/- ferning, + Amnisure  Dilation: 3 Effacement (%): 70 Cervical Position: Middle Station: -3 Presentation: Vertex Exam by:: Mayford Knife CNM   Musculoskeletal: Normal  range of motion. She exhibits no edema.  Neurological: She is alert and oriented to person, place, and time.  Skin: Skin is warm and dry.  Psychiatric: She has a normal mood and affect.    Prenatal labs: ABO, Rh:   Antibody:   Rubella:   RPR:    HBsAg:    HIV:    GBS:     Assessment/Plan: A:  SIUP at [redacted]w[redacted]d by LMP, 27 weeks by Fundal height      Preterm Premature Rupture of Membranes      Uterine irritability, no overt labor yet      No prenatal care   P:  Admit to Antenatal per Dr Despina Hidden       Routine antenatal orders        Amp and Erythro antibiotics        Magnesium Sulfate Neuroprophylaxis        GC/Ch/GBS sent        Prenatal labs        Betamethasone series       Neonatology consult        Ultrasound to confirm dates and gross anatomy         [facility] 07/14/2014, 9:22 AM

## 2014-07-14 NOTE — MAU Note (Signed)
Pt presents to MAU with complaints of leakage of fluid since 730 this morning. Reports no prenatal care because she was unsure if she was going to terminate pregnancy. Pt states she has not had an ultrasound at all with the pregnancy. Denies any vaginal bleeding

## 2014-07-15 ENCOUNTER — Inpatient Hospital Stay (HOSPITAL_COMMUNITY): Payer: Medicaid Other

## 2014-07-15 DIAGNOSIS — O283 Abnormal ultrasonic finding on antenatal screening of mother: Secondary | ICD-10-CM | POA: Diagnosis present

## 2014-07-15 LAB — GC/CHLAMYDIA PROBE AMP (~~LOC~~) NOT AT ARMC
Chlamydia: NEGATIVE
Neisseria Gonorrhea: NEGATIVE

## 2014-07-15 LAB — HEPATITIS B SURFACE ANTIGEN: HEP B S AG: NEGATIVE

## 2014-07-15 LAB — RUBELLA SCREEN: RUBELLA: 5.65 {index} (ref >0.99)

## 2014-07-15 NOTE — Progress Notes (Signed)
MD notified d/t pt wanting to leave unit to smoke despite RN discouragement.  Pt currently on Magnesium Sulfate & dizzy @ times.  Pt PPROM, 3cms, & hx of PTD @ 35wks.  Pt informed RN would obtain order for Nicotine patch.  Pt declines patch, prefers to leave unit to smoke.  States she's going regardless of what medical staff recommend.  MD states have pt sign AMA form regarding leaving unit.

## 2014-07-15 NOTE — Progress Notes (Signed)
Pt left unit AMA to smoke via wheelchair with family in attendance.

## 2014-07-15 NOTE — Clinical SW OB High Risk (Signed)
Clinical Social Work Antenatal   Clinical Social Worker:  Alphonzo Cruise,  Date/Time:  07/15/2014, 2:37 PM Gestational Age on Admission:  29 y.o. Admitting Diagnosis: PPROM   Expected Delivery Date:  09/17/14  [facility] Environment  Home Address: [place], [place], [place] [address]  Household Member/Support Name: Anastasio Champion Relationship:  Significant Other Other Support: Patient states her mother and two sisters are good supports.  Her mother and one sister were here with her today.  Her other sister is caring for her 34 year old today.   Psychosocial Data  Information Source:  Patient Interview Resources:    Employment: Patient was working at [employer] prior to hospitalization.  FOB works at [employer].   Medicaid [facility]): [employer]:    Current Grade:    Homebound Arranged: No  Other Resources:   (Medicaid potential)  Cultural/Environment Issues Impacting Care: None stated  Strengths/Weaknesses/Factors to Consider  Concerns Related to Hospitalization: Patient reports no concerns related to hospitalization, but states she has been going through "a lot" lately.     Previous Pregnancies/Feelings Towards Pregnancy?  Concerns related to being/becoming a mother?: Patient reports [facility] because she was contemplating termination of this pregnancy.  She reports she was 4.5 months along in the pregnancy when she decided to terminate so she had to go to [place] to have the procedure done.  She states she changed her mind when she got there.  She informed CSW that FOB did not want her to terminate and that she is happy with her decision to continue with the pregnancy.   Patient reports she has two other children.  Patient's chart states she is G5P3, however, she has encounters for 4 previous deliveries, 2003, 2006, 2009 and 2013.  2006 is not noted in her [facility].  CSW inquired about the child born in 2009 and she states she placed this baby for adoption.  CSW  then asked about the child in 2006, which she denies.  Records show that this baby was placed for adoption as well. Patient states that her 10 year old son, Audrea Muscat, lives with her PGM.  She explains this agreement was made due to multiple CPS calls by that child's father.  Patient also states she was young at the time and the decision was made to give her grandmother custody so that CPS would no longer be involved.  Patient states she has custody of her 4 year old son, Frederik Schmidt, and that he lives with her and FOB.  This pregnancy is the first child patient shares with FOB.  She states he has an 9 year old son who lives with his mother.    Social Support (FOB? Who is/will be helping with baby/other kids?): Patient reports FOB is involved and supportive.  She states he stayed overnight with her in the hospital last night with patient's 31 year old.  Patient reports that her mother and sisters also assist with her son and will care for him for as long as she needs to be in the hospital.  Couples Relationship (describe): Patient reports a positive relationship with FOB.  She states they have been together 3.5 years.   Recent Stressful Life Events (life changes in past year?): CSW asked patient to talk about "going through a lot" recently.  Patient states she "knew something was wrong with the baby."  She admits to using Cocaine as a way to cope with the stress and concern over the pregnancy.  She states she "felt crazy" and used  1 week ago.  She states her first use was at age 60 and that she used "once in a while" since that time until 2.5 years ago.  She states she quit completely 2.5 years ago, but relapsed 1 week ago due to stress.  She does not report any further stressful events recently.   Prenatal Care/Education/Home Preparations: Patient states she and FOB have begun gathering baby supplies for infant.  Patient has not received any prenatal care.     Domestic Violence (of any type):  No If Yes to  Domestic Violence, Describe/Action Plan:   Substance Use During Pregnancy: Yes Patient's UDS positive for Cocaine on admission  Follow-up Recommendations: CSW recommends substance use treatment.  Patient is willing to go to outpatient counseling and has no preference on where to seek services.  CSW feels the [facility] is appropriate for services and will make referral.   CSW is concerned about patient's positive UDS for Cocaine not only due to current pregnant status, but also since she reports caring for a 31 year old at home.  CSW feels it is necessary to make report to Child Protective Services for investigation once patient is discharged from the hospital. CSW offered supportive counseling while patient is in the hospital.   Patient Advised/Response: Patient is open to referral being made for outpatient counseling to address substance use. Patient states being overwhelmed with information that CPS will be contacted.  She began to take off the monitor stating that "I need to go smoke a cigarette."  She initially remained in her room, but CSW expects she will attempt to leave the unit to go outside.  CSW notified RN.   Other:     Clinical Assessment/Plan: CSW met with patient to complete assessment due to [facility], positive UDS for Cocaine and abnormal ultrasound findings.  CSW understood from RN that patient wanted CSW to ask her visitors to leave instead of giving the option for them to stay during assessment.  CSW asked RN to meet with patient to see if this was a good time to talk with her and she confirmed that it was.  CSW entered room and asked visitors to leave for privacy.  They agreed, but commented, "she doesn't need privacy," and rolled their eyes at the request.  Patient thanked CSW for asking them to step out.  She states her visitors were her mother, sister and nephew.  Patient states she did not receive PNC because she planned on terminating the pregnancy at 4.5 months.  She states  when she went for the procedure, she changed her mind.  She states that her water broke yesterday, bringing her in to the hospital.  Prior to this time, she states she "went to [facility] for care."  She is tearful about the ultrasound findings and the question of whether baby's heart is okay and if he has arms.  CSW validated her feelings and offered support.  Patient openly talked about her hx of Cocaine use and reports her most recent use was 1 time in the past 2.5 years.  She is open to counseling, but states she is not "addicted."  Patient was upset with CSW's decision to make a report to Child Protective Services.  CSW will follow up to provide patient with counseling information and will offer support as patient will allow.

## 2014-07-15 NOTE — Progress Notes (Signed)
US transducer readjusted, tracing sketchy @ times tracing meternal pulse

## 2014-07-15 NOTE — Progress Notes (Signed)
Patient ID: [name], female   DOB: 1985/05/24, 29 y.o.   MRN: [id number] FACULTY PRACTICE ANTEPARTUM(COMPREHENSIVE) NOTE  XITLALIC MASLIN is a 29 y.o. [id number] with Estimated Date of Delivery: 09/17/14   By  midtrimester ultrasound [redacted]w[redacted]d  who is admitted for PROM.    Fetal presentation is cephalic. Length of Stay:  1  Days  Date of admission:07/14/2014  Subjective:  Patient reports the fetal movement as active. Patient reports uterine contraction  activity as none. Patient reports  vaginal bleeding as none. Patient describes fluid per vagina as Clear.  Vitals:  Blood pressure 110/60, pulse 75, temperature 98.3 F (36.8 C), temperature source Oral, resp. rate 18, height  (1.727 m), weight 185 lb (83.915 kg), last menstrual period 12/03/2013, unknown if currently breastfeeding. Filed Vitals:   07/14/14 2100 07/14/14 2200 07/14/14 2300 07/15/14 0000  BP: 122/91 105/76 107/67 110/60  Pulse: 95 82 78 75  Temp:   98.3 F (36.8 C)   TempSrc:   Oral   Resp: Height:      Weight:       Physical Examination:  General appearance - alert, well appearing, and in no distress Abdomen - soft non tender Fundal Height:  size equals dates Pelvic Exam:  examination not indicated Cervical Exam: Not evaluated.  Extremities: extremities normal, atraumatic, no cyanosis or edema with DTRs 2+ bilaterally Membranes ruptured  Fetal Monitoring:  Baseline: 145 bpm, Variability: Good {> 6 bpm) and Accelerations: Reactive     Labs:  Results for orders placed or performed during the hospital encounter of 07/14/14 (from the past 24 hour(s))  Urinalysis, Routine w reflex microscopic (not at [facility])   Collection Time: 07/14/14  8:05 AM  Result Value Ref Range   Color, Urine YELLOW YELLOW   APPearance HAZY (A) CLEAR   Specific Gravity, Urine >1.030 (H) 1.005 - 1.030   pH 6.0 5.0 - 8.0   Glucose, UA NEGATIVE NEGATIVE mg/dL   Hgb urine dipstick TRACE (A) NEGATIVE   Bilirubin Urine  NEGATIVE NEGATIVE   Ketones, ur NEGATIVE NEGATIVE mg/dL   Protein, ur NEGATIVE NEGATIVE mg/dL   Urobilinogen, UA 0.2 0.0 - 1.0 mg/dL   Nitrite NEGATIVE NEGATIVE   Leukocytes, UA SMALL (A) NEGATIVE  Urine rapid drug screen (hosp performed)   Collection Time: 07/14/14  8:05 AM  Result Value Ref Range   Opiates NONE DETECTED NONE DETECTED   Cocaine POSITIVE (A) NONE DETECTED   Benzodiazepines NONE DETECTED NONE DETECTED   Amphetamines NONE DETECTED NONE DETECTED   Tetrahydrocannabinol NONE DETECTED NONE DETECTED   Barbiturates NONE DETECTED NONE DETECTED  Urine microscopic-add on   Collection Time: 07/14/14  8:05 AM  Result Value Ref Range   Squamous Epithelial / LPF FEW (A) RARE   WBC, UA 7-10 <3 WBC/hpf   RBC / HPF 0-2 <3 RBC/hpf   Bacteria, UA MANY (A) RARE   Urine-Other TRICHOMONAS PRESENT   Wet prep, genital   Collection Time: 07/14/14  8:53 AM  Result Value Ref Range   Yeast Wet Prep HPF POC NONE SEEN NONE SEEN   Trich, Wet Prep FEW (A) NONE SEEN   Clue Cells Wet Prep HPF POC NONE SEEN NONE SEEN   WBC, Wet Prep HPF POC FEW (A) NONE SEEN  Group B strep by PCR   Collection Time: 07/14/14  8:53 AM  Result Value Ref Range   Group B strep by PCR NEGATIVE NEGATIVE  Amnisure rupture of membrane (rom)not at  [facility]   Collection Time: 07/14/14  8:53 AM  Result Value Ref Range   Amnisure ROM POSITIVE   RPR   Collection Time: 07/14/14  9:10 AM  Result Value Ref Range   RPR Ser Ql Non Reactive Non Reactive  CBC   Collection Time: 07/14/14  9:10 AM  Result Value Ref Range   WBC 8.2 4.0 - 10.5 K/uL   RBC 3.65 (L) 3.87 - 5.11 MIL/uL   Hemoglobin 11.8 (L) 12.0 - 15.0 g/dL   HCT 60.433.8 (L) 54.036.0 - 98.146.0 %   MCV 92.6 78.0 - 100.0 fL   MCH 32.3 26.0 - 34.0 pg   MCHC 34.9 30.0 - 36.0 g/dL   RDW 19.112.6 47.811.5 - 29.515.5 %   Platelets 247 150 - 400 K/uL  Differential   Collection Time: 07/14/14  9:10 AM  Result Value Ref Range   Neutrophils Relative % 63 43 - 77 %   Neutro Abs 5.1 1.7 - 7.7  K/uL   Lymphocytes Relative 29 12 - 46 %   Lymphs Abs 2.4 0.7 - 4.0 K/uL   Monocytes Relative 6 3 - 12 %   Monocytes Absolute 0.5 0.1 - 1.0 K/uL   Eosinophils Relative 2 0 - 5 %   Eosinophils Absolute 0.1 0.0 - 0.7 K/uL   Basophils Relative 0 0 - 1 %   Basophils Absolute 0.0 0.0 - 0.1 K/uL  Rapid HIV screen (HIV 1/2 Ab+Ag)   Collection Time: 07/14/14  9:10 AM  Result Value Ref Range   HIV-1 P24 Antigen - HIV24 NON REACTIVE NON REACTIVE   HIV 1/2 Antibodies NON REACTIVE NON REACTIVE   Interpretation (HIV Ag Ab)      A non reactive test result means that HIV 1 or HIV 2 antibodies and HIV 1 p24 antigen were not detected in the specimen.  Type and screen   Collection Time: 07/14/14  9:10 AM  Result Value Ref Range   ABO/RH(D) O POS    Antibody Screen NEG    Sample Expiration 07/17/2014     Imaging Studies:      Medications:  Scheduled . ampicillin (OMNIPEN) IV  2 g Intravenous Q6H   Followed by  . [START ON 07/16/2014] amoxicillin  500 mg Oral Q8H  . betamethasone acetate-betamethasone sodium phosphate  12 mg Intramuscular Q24H  . docusate sodium  100 mg Oral Daily  . erythromycin  250 mg Intravenous Q6H   Followed by  . [START ON 07/16/2014] erythromycin  250 mg Oral Q6H  . metroNIDAZOLE  500 mg Oral Q12H  . prenatal multivitamin  1 tablet Oral Q1200   I have reviewed the patient's current medications.  ASSESSMENT: [id number] Estimated Date of Delivery: 09/17/14  PPROM No PNC Cocaine abuse  Patient Active Problem List   Diagnosis Date Noted  . Preterm premature rupture of membranes (PPROM) with unknown onset of labor 07/14/2014  . Trichomonal vaginitis during pregnancy 07/14/2014  . Cocaine abuse affecting pregnancy in third trimester 07/14/2014  . Vaginal birth after cesarean delivery 09/20/2011  . Pregnancy with history of pre-term labor 08/05/2011  . Previous cesarean delivery, antepartum condition or complication 08/05/2011  . Tobacco smoking complicating  pregnancy in third trimester 08/05/2011  . Supervision of normal pregnancy 07/30/2011  . GBS (group B streptococcus) UTI complicating pregnancy 07/14/2011    PLAN: Continue inpatient monitoring with antibiotics,  2nd dose of betamethasone, completed CP prophylaxis of magnesium  Repeat sonogram today to evaluate fetal arms  [employer] [facility] 07/15/2014,7:46 AM

## 2014-07-16 ENCOUNTER — Ambulatory Visit (HOSPITAL_COMMUNITY)
Admit: 2014-07-16 | Discharge: 2014-07-16 | Disposition: A | Discharge status: Home / Self Care | Payer: Medicaid Other | Attending: Obstetrics & Gynecology | Admitting: Obstetrics & Gynecology

## 2014-07-16 ENCOUNTER — Inpatient Hospital Stay (HOSPITAL_COMMUNITY): Payer: Medicaid Other

## 2014-07-16 DIAGNOSIS — Z3A31 31 weeks gestation of pregnancy: Secondary | ICD-10-CM

## 2014-07-16 DIAGNOSIS — O283 Abnormal ultrasonic finding on antenatal screening of mother: Secondary | ICD-10-CM

## 2014-07-16 LAB — HEMOGLOBIN A1C
Hgb A1c MFr Bld: 5.4 % (ref 4.8–5.6)
Mean Plasma Glucose: 108 mg/dL

## 2014-07-16 MED ORDER — LACTATED RINGERS IV BOLUS (SEPSIS)
500.0000 mL | Freq: Once | INTRAVENOUS | Status: AC
  Administered 2014-07-16: 500 mL via INTRAVENOUS

## 2014-07-16 NOTE — Progress Notes (Signed)
Dr Elizebeth Brookingotton at bedside doing echo.

## 2014-07-16 NOTE — Progress Notes (Signed)
Visit Summary Note  Appointment Date: 07/16/2014 Referred By: Florian Buff, MD  Date of Birth: [birth year]  Pregnancy history: [id number] Estimated Date of Delivery: 09/17/14 Estimated Gestational Age: 35w0dAttending: WBenjaman Lobe MD   I met with Ms. Jenny Leveringfor genetic counseling because of abnormal ultrasound findings.  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 Reportedly within normal limits  Reviewed etiologies for findings including: genetic (chromosomal or single gene), teratogenic, and multifactorial  Genetic testing declined today; patient wishes to pursue postnatal testing  Patient wishes to have a postnatal consult with a medical geneticist after delivery  Patient admits that she used cocaine 1 week ago, but denies earlier use during the pregnancy  Reviewed the effects of cocaine use during pregnancy and association with limb defects  Family history of possible hydrocephalus  [facility]  Prior to this time, she received no prenatal care. She reported that she planned to terminate the pregnancy and then changed her mind.  After admission, a detailed ultrasound revealed oligohydramnios, upper extremity abnormalities, and a possible fetal heart defect. An additional ultrasound was performed on July 15, 2014, which confirmed these findings. The fetal images were somewhat limited by fetal positioning secondary to oligohydramnios. The fetal left arm appears to be completely absent; the fetal right arm also appears to be significantly shortened or absent. Some views of the right arm suggest a remnant of bone. The fetal heart views at times suggested the possibility of transposition of the great vessels.  Fetal growth appeared to be within normal limits; however, the pregnancy was dated by  ultrasound on July 14, 2014. The patient had a fetal echocardiogram this afternoon. The report has not been documented. The patient reported that the fetal echocardiogram was normal and that no follow-up is required. We spoke with the patient's nurse, who confirmed this information.     We discussed the fetal findings in detail.  Specifically, we discussed that congenital anomalies can occur as isolated, nonsyndromic birth defects, or as features of an underlying genetic syndrome.  We discussed that most often, isolated upper extremity anomalies are isolated and not a feature of an underlying genetic condition.   The risk for a genetic etiology increases with the presence of multiple fetal anatomic differences.  Considering the specific ultrasound findings, we discussed that the risk for fetal aneuploidy is slightly increased.  We reviewed chromosomes, nondisjunction, and the common features of specific aneuploidies. In addition, we discussed the risk for other chromosome aberrations including microdeletions, duplications, insertions, and translocations.      Ms. OKenedywas then counseled regarding the availability of screening and diagnostic testing for fetal aneuploidy.  Specifically, we discussed the option of noninvasive prenatal screening (NIPS)/cell free DNA testing (cfDNA testing).  We discussed that this technology analyzes placental DNA in maternal circulation.  Although this testing has a high detection rate for specific aneuploidy and a low false positive rate, it is not considered to be diagnostic.  We also reviewed the option of postnatal chromosome analysis (peripheral blood or cord blood).  Additionally, we discussed the availability of microarray analysis, which is a molecular based technique in which a test sample of DNA (fetal) is compared to a reference (normal) genome in order to determine if the test sample has any extra or missing genetic information.  Microarray analysis allows for the  detection of  genetic deletions and duplications that are 130 times smaller than those identified by routine chromosome analysis.  We discussed that recent publications show that approximately 6% of patients with an abnormal fetal ultrasound and a normal fetal karyotype had a significant microdeletion/microduplication detected by prenatal microarray analysis.  We discussed that prenatal diagnosis for chromosome conditions is typically available via amniocentesis; however, given the rupture of membranes and resulting oligohydramnios, we discussed that this option is challenging. After thoughtful consideration of her options, Ms. Hendriks declined prenatal testing for chromosome conditions. She wishes to have postnatal testing, if warranted.   We then discussed other possible explanations for the above discussed ultrasound findings including single gene conditions.  Single gene conditions are typically tested for postnatally, based on the recommendation of a medical geneticist, unless ultrasound findings or the family history are strongly suggestive of a specific syndrome.  Ms. Shambaugh was counseled that there are many single gene conditions which can cause upper extremity anomalies, including Cornelia de Lange syndrome (CdLS), Holt-Oram, TAR, Nager, Fanconi anemia and others.  We briefly reviewed the features of several of these conditions. Considering that today's fetal echocardiogram was reportedly normal, the likelihood of the fetus having CdLS or Holt-Oram syndrome is likely low. There are no other features by ultrasound that are concerning for a specific single gene condition. We discussed the availability of a postnatal evaluation by a medical geneticist, to determine if a specific genetic etiology is suspected.  Ms. Hannig expressed interest in having her son evaluated at birth.  We discussed that congenital anomalies can also result from teratogenic exposures.  Ms. Belizaire UDS on admission was positive  for cocaine.  She reported that she has a history of cocaine abuse in the past and that she has not used, until last week, for 2.5 years. She vehemently denied use of other illicit substances, medications, or alcohol during this pregnancy. She insisted multiple times that she used cocaine only one time during the last week. She could not tell me the specific date that she used or the amount. She previously discussed her history of cocaine use with the Education officer, museum and reported that she is open to discuss counseling, but does not feel that she has a problem.  Ms. Gladwin was counseled that cocaine crosses the placenta, enters fetal circulation, and is cleared more slowly from fetal circulation than adult circulation.  We discussed that literature varies as to whether cocaine use during pregnancy is associated with an increased risk for birth defects; however, there is literature that suggests that the risk for vascular disruptions, which may lead to heart, limb, intestinal, and genitourinary anomalies.  Babies who are born to mothers who have used cocaine during pregnancy, especially when the cocaine use occurred close to the time of delivery, have an increased risk to be irritable, jittery, and have interrupted sleep patterns as well as visual disturbances and sensory issues.  In utero cocaine exposure is also associated with an increased risk for specific neurodevelopmental concerns in exposed children including: ADHD, reduced self-control, language difficulties, and increased need for special education.  Growth delays are also common.  Ms. Valent was counseled that the prognosis and postnatal management depend on the underlying etiology of the fetal differences and the specific associated anomalies.  Ms. Lopresti was understandably upset today. She reported that she has a great support system with her mother, her sister, and the FOB.  She reported that the FOB is involved.    Both family histories were  reviewed and  found to be contributory for Ms. Lampson having two paternal first cousins (sibs to one another through her paternal aunt) who "are severely disabled".  After further questioning, I believe that these relatives may have hydrocephalus. The female cousin is more severely affected and has profound intellectual disability. She reported that he was "a water-head baby".  We discussed that the technical term for this is hydrocephalus. We reviewed that  hydrocephalus can be isolated (nonsyndromic) or seen as one feature of an underlying chromosome or genetic condition. Hydrocephalus is typically isolated and multifactorial, involving a combination of genetic and environmental contributing factors.  Rarely, nonsyndromic hydrocephalus can follow autosomal recessive or autosomal dominant inheritance. X-linked hydrocephalus is also observed in some families. We discussed that when isolated and when multifactorial inheritance is suspected, recurrence risk for full siblings is approximately 1-2%. Thus, given the reported family history and the degree of relation (a third degree relative to the current pregnancy), recurrence risk for the current pregnancy is likely low.  In the case of X-linked inheritance, the risk is also likely low, considering that Ms. Keesey's father was unaffected. Additional information regarding an underlying cause for hydrocephalus in the family may alter recurrence risk assessment.  I counseled Ms. Giaimo regarding the above risks and available options.  The approximate face-to-face time with the genetic counselor was 38 minutes.  Sharyne Richters, MS,  Insurance risk surveyor

## 2014-07-16 NOTE — Progress Notes (Signed)
Patient requesting to be taken off the monitor.

## 2014-07-16 NOTE — Progress Notes (Signed)
I received a referral from pt's nurse due to multiple complications with baby.  [name] reported that she and FOB are doing a lot of thinking together, but that they are "doing fine" (her words) for now.  I asked her if it would be okay for me to check in on her in a day or so and she replied that that would be fine.    Chaplain Dyanne CarrelKaty Fanchon [place], Bcc Pager, [phone] 1:15 PM

## 2014-07-16 NOTE — Progress Notes (Signed)
FACULTY PRACTICE ANTEPARTUM COMPREHENSIVE PROGRESS NOTE  Jenny HarrierReteka C Bauer is a 29 y.o. [phone] at 7913w0d who is admitted for PPROM on 07/14/14 int he setting of no prenatal care, cocaine use.  Fetal ultrasound revealed upper extremity anomalies and cardiac anomalies, ?TGV.  Fetal ECHO scheduled today 07/16/14.  Estimated Date of Delivery: 09/17/14  Fetal presentation is cephalic.  Length of Stay:  2 Days. Admitted 07/14/2014  Subjective: Patient is appropriately sad and wants to know fetal ECHO results and subsequent plans. Denies abdominal pain. Patient reports good fetal movement.  She reports no uterine contractions, no bleeding and no loss of fluid per vagina.  Vitals:  Blood pressure 100/55, pulse 81, temperature 98.5 F (36.9 C), temperature source Oral, resp. rate 16, height 5\' 8"  (1.727 m), weight 185 lb (83.915 kg), last menstrual period 12/03/2013, unknown if currently breastfeeding. Physical Examination: CONSTITUTIONAL: Well-developed, well-nourished female in no acute distress.  HENT:  Normocephalic, atraumatic, External right and left ear normal. Oropharynx is clear and moist EYES: Conjunctivae and EOM are normal. Pupils are equal, round, and reactive to light. No scleral icterus.  NECK: Normal range of motion, supple, no masses SKIN: Skin is warm and dry. No rash noted. Not diaphoretic. No erythema. No pallor. NEUROLGIC: Alert and oriented to person, place, and time. Normal reflexes, muscle tone coordination. No cranial nerve deficit noted. PSYCHIATRIC: Normal mood and affect. Normal behavior. Normal judgment and thought content. CARDIOVASCULAR: Normal heart rate noted, regular rhythm RESPIRATORY: Effort and breath sounds normal, no problems with respiration noted MUSCULOSKELETAL: Normal range of motion. No edema and no tenderness. 2+ distal pulses. ABDOMEN: Soft, nontender, nondistended, gravid. CERVIX: Dilation: 3 Effacement (%): 70 Cervical Position: Middle Station:  -3 Presentation: Vertex Exam by:: Mayford KnifeWilliams CNM  Fetal monitoring: FHR: 120 bpm, Variability: moderate, Accelerations: Present, Decelerations: Absent  Uterine activity: No contractions  Results for orders placed or performed during the hospital encounter of 07/14/14 (from the past 48 hour(s))  Am[facility]isure [facility] membrane (rom)[facility]     Status: None   Collection Time: 07/14/14  8:53 AM  Result Value Ref Range   Amnisure ROM POSITIVE   Wet prep, genital     Status: Abnormal   Collection Time: 07/14/14  8:53 AM  Result Value Ref Range   Yeast Wet Prep HPF POC NONE SEEN NONE SEEN   Trich, Wet Prep FEW (A) NONE SEEN   Clue Cells Wet Prep HPF POC NONE SEEN NONE SEEN   WBC, Wet Prep HPF POC FEW (A) NONE SEEN    Comment: MANY BACTERIA SEEN  Group B strep by PCR     Status: None   Collection Time: 07/14/14  8:53 AM  Result Value Ref Range   Group B strep by PCR NEGATIVE NEGATIVE  Hepatitis B surface antigen     Status: None   Collection Time: 07/14/14  9:10 AM  Result Value Ref Range   Hepatitis B Surface Ag Negative Negative    Comment: (NOTE) [phone] July 15, 2014  1:16pm A duplicate report has been generated due to demographic updates. Performed At: [facility] [address] [place], [place] [id number] Mila HomerHancock William F MD JY:[id number]:[phone] CORRECTED ON 07/11 AT 2106: PREVIOUSLY REPORTED AS NEGATIVE   Rubella screen     Status: None   Collection Time: 07/14/14  9:10 AM  Result Value Ref Range   Rubella 5.65 Immune >0.99 index    Comment: (NOTE)  Non-immune       <0.90                                Equivocal  0.90 - 0.99                                Immune           >0.99 Performed At: [facility] [address] [place], [place] [id number] Mila Homer MD WU:[id number]   RPR     Status: None   Collection Time: 07/14/14  9:10 AM  Result Value Ref Range   RPR Ser Ql Non Reactive Non  Reactive    Comment: (NOTE) Performed At: [facility] [address] [place], [place] [id number] Mila Homer MD VH:[id number]   CBC     Status: Abnormal   Collection Time: 07/14/14  9:10 AM  Result Value Ref Range   WBC 8.2 4.0 - 10.5 K/uL   RBC 3.65 (L) 3.87 - 5.11 MIL/uL   Hemoglobin 11.8 (L) 12.0 - 15.0 g/dL   HCT 41.3 (L) 24.4 - 01.0 %   MCV 92.6 78.0 - 100.0 fL   MCH 32.3 26.0 - 34.0 pg   MCHC 34.9 30.0 - 36.0 g/dL   RDW 27.2 53.6 - 64.4 %   Platelets 247 150 - 400 K/uL  Differential     Status: None   Collection Time: 07/14/14  9:10 AM  Result Value Ref Range   Neutrophils Relative % 63 43 - 77 %   Neutro Abs 5.1 1.7 - 7.7 K/uL   Lymphocytes Relative 29 12 - 46 %   Lymphs Abs 2.4 0.7 - 4.0 K/uL   Monocytes Relative 6 3 - 12 %   Monocytes Absolute 0.5 0.1 - 1.0 K/uL   Eosinophils Relative 2 0 - 5 %   Eosinophils Absolute 0.1 0.0 - 0.7 K/uL   Basophils Relative 0 0 - 1 %   Basophils Absolute 0.0 0.0 - 0.1 K/uL  Type and screen     Status: None   Collection Time: 07/14/14  9:10 AM  Result Value Ref Range   ABO/RH(D) O POS    Antibody Screen NEG    Sample Expiration 07/17/2014   Rapid HIV screen (HIV 1/2 Ab+Ag)     Status: None   Collection Time: 07/14/14  9:10 AM  Result Value Ref Range   HIV-1 P24 Antigen - HIV24 NON REACTIVE NON REACTIVE   HIV 1/2 Antibodies NON REACTIVE NON REACTIVE   Interpretation (HIV Ag Ab)      A non reactive test result means that HIV 1 or HIV 2 antibodies and HIV 1 p24 antigen were not detected in the specimen.  Hemoglobin A1c     Status: None   Collection Time: 07/14/14  9:10 AM  Result Value Ref Range   Hgb A1c MFr Bld 5.4 4.8 - 5.6 %    Comment: (NOTE)         Pre-diabetes: 5.7 - 6.4         Diabetes: >6.4         Glycemic control for adults with diabetes: <7.0    Mean Plasma Glucose 108 mg/dL    Comment: (NOTE) Performed At: [facility] [address] [place], [place] [id number] Mila Homer MD  GL:[id number]     US Ob Comp +  14 Wk  07/14/2014   OBSTETRICAL ULTRASOUND: This exam was performed within a [facility]. The OB US report was generated in the AS system, and faxed to the ordering physician.   This report is available in the [employer]. See the AS Obstetric US report via the Image Link.  US Ob Detail + 14 Wk  07/15/2014   OBSTETRICAL ULTRASOUND: This exam was performed within a [facility]. The OB US report was generated in the AS system, and faxed to the ordering physician.   This report is available in the [employer]. See the AS Obstetric US report via the Image Link.   Current scheduled medications . amoxicillin  500 mg Oral Q8H  . docusate sodium  100 mg Oral Daily  . erythromycin  250 mg Oral Q6H  . metroNIDAZOLE  500 mg Oral Q12H  . prenatal multivitamin  1 tablet Oral Q1200    I have reviewed the patient's current medications.  ASSESSMENT: Patient Active Problem List   Diagnosis Date Noted  . Abnormal fetal ultrasound   . Preterm premature rupture of membranes (PPROM) with unknown onset of labor 07/14/2014  . Trichomonal vaginitis during pregnancy 07/14/2014  . Cocaine abuse affecting pregnancy in third trimester 07/14/2014  . Vaginal birth after cesarean delivery 09/20/2011  . Pregnancy with history of pre-term labor 08/05/2011  . Previous cesarean delivery, antepartum condition or complication 08/05/2011  . Tobacco smoking complicating pregnancy in third trimester 08/05/2011  . Supervision of normal pregnancy 07/30/2011  . GBS (group B streptococcus) UTI complicating pregnancy 07/14/2011    PLAN: Will await fetal ECHO results and determine need for possible transfer of patient if TGV is confirmed No signs/symptoms of chorioamnionitis for now Delivery indicated for chorioamnionitis or other signs/symptoms of significant fetal distress Had social work consult Continue routine inpatient antenatal  care.   Jaynie Collins, MD, FACOG Attending Obstetrician & Gynecologist Faculty Practice, [facility]

## 2014-07-17 MED ORDER — LACTATED RINGERS IV BOLUS (SEPSIS)
500.0000 mL | Freq: Once | INTRAVENOUS | Status: AC
  Administered 2014-07-17: 500 mL via INTRAVENOUS

## 2014-07-17 NOTE — Progress Notes (Signed)
Patient ID: Jenny HarrierReteka C Kolarik, female   DOB: [birth year], 29 y.o.   MRN: [id number] FACULTY PRACTICE ANTEPARTUM(COMPREHENSIVE) NOTE  [name] is a 29 y.o. [id number] at 1977w1d by best clinical estimate who is admitted for PROM.   Fetal presentation is cephalic. Length of Stay:  3  Days  Subjective: Notes increasing contractions overnight. Increased loss of fluid as well. Reports still feeling contractions but not as strong this am. Patient reports the fetal movement as active. Patient reports uterine contraction  activity as irregular, every 2-6 minutes. Patient reports  vaginal bleeding as none. Patient describes fluid per vagina as Clear.  Vitals:  Blood pressure 119/61, pulse 79, temperature 98.8 F (37.1 C), temperature source Oral, resp. rate 20, height 5\' 8"  (1.727 m), weight 185 lb (83.915 kg), last menstrual period 12/03/2013, unknown if currently breastfeeding. Physical Examination:  General appearance - alert, well appearing, and in no distress Abdomen - Gravid, NT Fundal Height:  size equals dates Extremities: extremities normal, atraumatic, no cyanosis or edema  Membranes:intact  Fetal Monitoring:  Baseline: 140 bpm, Variability: Good {> 6 bpm), Accelerations: Non-reactive but appropriate for gestational age and Decelerations: Absent  Fetal ECHO: reportedly normal--official results not available currently. Medications:  Scheduled . amoxicillin  500 mg Oral Q8H  . docusate sodium  100 mg Oral Daily  . erythromycin  250 mg Oral Q6H  . metroNIDAZOLE  500 mg Oral Q12H  . prenatal multivitamin  1 tablet Oral Q1200   I have reviewed the patient's current medications.  ASSESSMENT: Patient Active Problem List   Diagnosis Date Noted  . Abnormal fetal ultrasound   . Preterm premature rupture of membranes (PPROM) with unknown onset of labor 07/14/2014  . Trichomonal vaginitis during pregnancy 07/14/2014  . Cocaine abuse affecting pregnancy in third trimester 07/14/2014  .  History of VBAC x 2 09/20/2011  . Previous preterm delivery, antepartum 08/05/2011  . Previous cesarean delivery, antepartum condition or complication 08/05/2011  . Tobacco smoking complicating pregnancy in third trimester 08/05/2011    PLAN: May be going into labor--would need speculum exam.  Pt. Aware of need to notify us if contractions/pressure become worse. [facility].  Reva BoresPRATT,TANYA S, MD 07/17/2014,7:34 AM

## 2014-07-17 NOTE — Progress Notes (Signed)
Patient leaving antenatal unit via wheelchair with her family.

## 2014-07-17 NOTE — Progress Notes (Signed)
Spoke with Shelia MediaMarie Williams-CNM, made aware pt woke up with lower abdominal pain and pt was placed on monitor and toco, pt having occasional contractions with UI, pt to receive 500mL bolus of LR and to continue to monitor pt with EFM until further notice per Shelia MediaMarie Williams-CNM

## 2014-07-17 NOTE — Progress Notes (Signed)
Patient back from wheelchair ride.

## 2014-07-17 NOTE — Progress Notes (Signed)
Patient ID: [name], female   DOB: February 19, 1985, 29 y.o.   MRN: [id number] Called to see patient re: new onset of uterine contractions  Filed Vitals:   07/16/14 2211 07/16/14 2214 07/17/14 0255 07/17/14 0352  BP:  119/61    Pulse:  79    Temp:  98.8 F (37.1 C)    TempSrc:  Oral    Resp: 18 18 20 20   Height:      Weight:       FHR reassuring, not reactive by criteria, but reassuring Earlier had contractions every 2 min  No bleeding  Continues to leak small amt fluid  I ordered a 500cc fluid bolus  Contractions seem less frequent now Patient states they hurt less when she is up Abdomen slightly tender to palpation but this was during a contraction  Overall belly is not very tender  Fetus active  Will continue to observe for increased uterine activity

## 2014-07-18 ENCOUNTER — Encounter (HOSPITAL_COMMUNITY): Payer: Self-pay | Admitting: *Deleted

## 2014-07-18 LAB — TYPE AND SCREEN
ABO/RH(D): O POS
Antibody Screen: NEGATIVE

## 2014-07-18 NOTE — Progress Notes (Signed)
Pt requesting to go outside to smoke, RN discouraged activity secondary current hx (PTL, cervical dilatation, PPROM, and + cocaine use).  Pt informed if she goes outside despite not having order to do so she must sign AMA form considering she's leaving Antenatal unit against MD's permission.  Pt verbalized understanding, willing to sign form.

## 2014-07-18 NOTE — Consult Note (Signed)
I had the pleasure of seeing [name] in consultation as an inpatient at [facility] on July 16, 2014 for consideration of possible fetal structural heart disease. The patient is a 29 year old 85P2113 female who is 31 weeks 3 days pregnant with a singleton fetus. She was admitted on 07/14/14 for premature rupture of membranes. She had a ultrasound performed that showed possible transposition of the great vessels. This prompted me to evaluate her fetus to help in determining appropriate delivery location.  Patient Active Problem List   Diagnosis Date Noted  . Abnormal fetal ultrasound   . Preterm premature rupture of membranes (PPROM) with unknown onset of labor 07/14/2014  . Trichomonal vaginitis during pregnancy 07/14/2014  . Cocaine abuse affecting pregnancy in third trimester 07/14/2014  . Vaginal birth after cesarean delivery 09/20/2011  . Pregnancy with history of pre-term labor 08/05/2011  . Previous cesarean delivery, antepartum condition or complication 08/05/2011  . Tobacco smoking complicating pregnancy in third trimester 08/05/2011  . Supervision of normal pregnancy 07/30/2011  . GBS (group B streptococcus) UTI complicating pregnancy 07/14/2011       Physical exam was deferred today. A complete fetal echocardiogram was performed. The results are available in EPIC under the chart review section and the CV procedure tab. The scan showed a structurally normal heart with normal orientation of the great vessels. The aorta and pulmonary artery both appeared to lie close to one another aorta appears to arise from the left ventricle normal head and neck vessels, and the pulmonary artery arises anteriorly from the right ventricle with a typical flattened course. There was right to left shunting noted across the ductus arteriosus. There is normal Doppler flow seen across all valves. Biventricular size and function appear to be normal. Heart rate was regular  and appeared to be sinus with one-to-one conduction.  I sat down with [name] and explained the findings on fetal echocardiogram today. I told her that I did not think she needed to be transferred to a tertiary care center for structural cardiac defect. I do not understand she got multiple limb anomalies and other non cardiac issues and I deferred to her obstetrician as to the best management plan for these. If there are any questions or concerns about the baby's heart, we can always reimage the baby in the nursery if needed. If there is any [facility] of [facility] non[facility] issues prior to delivery, I will be happy to facilitate that if needed.  Thank you for letting me participate in the care of this very interesting patient. Should the be any other questions or concerns, please don't hesitate to contact me.  Dalene SeltzerJohn Americo Vallery, MD Professor, Division of Pediatric Cardiology Director, Pediatric and Fetal Echocardiography Laboratory [facility] [employer] [phone], [phone] or page directly [phone]

## 2014-07-18 NOTE — Progress Notes (Addendum)
Patient ID: [name], female   DOB: [birth year], 29 y.o.   MRN: [id number] FACULTY PRACTICE ANTEPARTUM(COMPREHENSIVE) NOTE  [name] is a 29 y.o. [id number] at 4460w2d by best clinical estimate who is admitted for rupture of membranes.   Fetal presentation is cephalic. Fetal absence of arms noted on u/s  Cardiac echo normal Length of Stay:  4  Days  Subjective: Pt denies any complaints changes. Patient reports the fetal movement as active. Patient reports uterine contraction  activity as none. Patient reports  vaginal bleeding as none. Patient describes fluid per vagina as Clear.  Vitals:  Blood pressure 123/70, pulse 76, temperature 98.2 F (36.8 C), temperature source Oral, resp. rate 20, height 5\' 8"  (1.727 m), weight 91.037 kg (200 lb 11.2 oz), last menstrual period 12/03/2013, unknown if currently breastfeeding. Physical Examination:  General appearance - alert, well appearing, and in no distress Heart - normal rate and regular rhythm Abdomen - soft, nontender, nondistended Fundal Height:  size equals dates Cervical Exam: Not evaluated. and found to be not evaluated/  and fetal presentation is cephalic. Extremities: extremities normal, atraumatic, no cyanosis or edema and Homans sign is negative, no sign of DVT with DTRs 2+ bilaterally Membranes:intact, ruptured  Fetal Monitoring:  Baseline: 145 bpm, Variability: Fair (1-6 bpm) and Accelerations: Non-reactive but appropriate for gestational age  Labs:  No results found for this or any previous visit (from the past 24 hour(s)).  Imaging Studies:     Currently EPIC will not allow sonographic studies to automatically populate into notes.  In the meantime, copy and paste results into note or free text.  Medications:  Scheduled . amoxicillin  500 mg Oral Q8H  . docusate sodium  100 mg Oral Daily  . erythromycin  250 mg Oral Q6H  . metroNIDAZOLE  500 mg Oral Q12H  . prenatal multivitamin  1 tablet Oral Q1200   I have  reviewed the patient's current medications.  ASSESSMENT: Patient Active Problem List   Diagnosis Date Noted  . Abnormal fetal ultrasound   . Preterm premature rupture of membranes (PPROM) with unknown onset of labor 07/14/2014  . Trichomonal vaginitis during pregnancy 07/14/2014  . Cocaine abuse affecting pregnancy in third trimester 07/14/2014  . History of VBAC x 2 09/20/2011  . Previous preterm delivery, antepartum 08/05/2011  . Previous cesarean delivery, antepartum condition or complication 08/05/2011  . Tobacco smoking complicating pregnancy in third trimester 08/05/2011    PLAN: Complete latency antibiotics.  Monitor for S& S of chorioamnionitis. Social work consult to assess home care options forpostpartum care Mazzie Brodrick V 07/18/2014,9:39 AM

## 2014-07-18 NOTE — Progress Notes (Signed)
Pt back from smoking.

## 2014-07-18 NOTE — Progress Notes (Signed)
Pt requesting a wheel chair so she can go smoke. Pt advised against it. AMA formed signed. IV saline locked. Pt given a wheelchair.

## 2014-07-19 MED ORDER — SODIUM CHLORIDE 0.9 % IJ SOLN
3.0000 mL | Freq: Two times a day (BID) | INTRAMUSCULAR | Status: DC
  Administered 2014-07-19 – 2014-07-20 (×2): 3 mL via INTRAVENOUS

## 2014-07-19 NOTE — Progress Notes (Signed)
I followed up with Jazzmen to see if she had a need for further support.  She reported that she is doing fine and does not wish to talk at this time.  [address]Chaplain Dyanne CarrelKaty Jaysten Essner, Bcc Pager, [phone] 2:23 PM    07/19/14 1400  Clinical Encounter Type  Visited With Patient  Visit Type Follow-up

## 2014-07-19 NOTE — Consult Note (Addendum)
[facility] 07/19/2014    10:51 AM  Neonatal Medicine Consultation         Jenny HarrierReteka C Fox          MRN:  [id number]  I was called at the request of the patient's obstetrician (Dr. Jolayne Pantheronstant) to speak to this patient due to probable preterm delivery due to recent PROM.    The patient's prenatal course includes PROM on 07/14/14, no prenatal care, and ultrasound evidence of absence of the left arrm, and possibly loss of right arm.  She is 31 3/7 weeks currently.  She is admitted to antenatal unit, and is receiving treatment that includes betamethasone (7/10, 7/11), ampicillin/amoxicillin, erythromycin, metronidazole.  I reviewed expectations for a baby born at 31-32 weeks, including survival, length of stay, morbidities such as respiratory distress, IVH, infection, feeding intolerance, retinopathy.  I described how we provide respiratory and feeding support.  Mom does not plansto breast feed.  I let mom know that the baby's outlook generally improves the longer she remains undelivered.  Further evaluation of the upper extremities will be needed once the baby is born, possibly including genetic and orthopedic input.   I spent 20 minutes reviewing the record, speaking to the patient, and entering appropriate documentation.  More than 50% of the time was spent face to face with patient.   _____________________ Electronically Signed By: Angelita InglesMcCrae S. Hadyn Azer, MD Neonatologist

## 2014-07-19 NOTE — Progress Notes (Signed)
Initial Nutrition Assessment  DOCUMENTATION CODES:   Not applicable  INTERVENTION: Regular diet and Snacks TID    NUTRITION DIAGNOSIS:   Increased nutrient needs related to  (pregnancy and fetal growth requirements) as evidenced by  ([redacted] weeks gestation).   GOAL:   Patient will meet greater than or equal to 90% of their needs   MONITOR:   Weight trends  REASON FOR ASSESSMENT:   Antenatal    ASSESSMENT: 31 3/7 weeks with PROM. Diet tolerated well/appetite good. Pre-pregnancy weight 185 lbs, BMI 28.2. Weight gain goal 15-25 lbs. Has experienced a 15 lb overall weight gain     Diet Order:  Diet regular Room service appropriate?: Yes; Fluid consistency:: Thin  Height:   Ht Readings from Last 1 Encounters:  07/14/14 5\' 8"  (1.727 m)    Weight:   Wt Readings from Last 1 Encounters:  07/17/14 200 lb 11.2 oz (91.037 kg)    Ideal Body Weight:     Wt Readings from Last 10 Encounters:  07/17/14 200 lb 11.2 oz (91.037 kg)  03/02/14 185 lb (83.915 kg)  09/19/11 201 lb (91.173 kg)  09/16/11 198 lb (89.812 kg)  09/02/11 193 lb 9.6 oz (87.816 kg)  08/19/11 191 lb 8 oz (86.864 kg)  08/05/11 192 lb 11.2 oz (87.408 kg)  07/07/11 190 lb (86.183 kg)    BMI:  Body mass index is 30.52 kg/(m^2).  Estimated Nutritional Needs:   Kcal:  2100-2200  Protein:  89-99 g  Fluid:  2.3 L  EDUCATION NEEDS:   No education needs identified at this time  Inez PilgrimKatherine Surina Storts M.Odis LusterEd. R.D. LDN Neonatal Nutrition Support Specialist/RD III Pager [phone]      Phone [phone]

## 2014-07-19 NOTE — Progress Notes (Addendum)
Patient ID: [name], female   DOB: 12/17/1985, 29 y.o.   MRN: [id number] FACULTY PRACTICE ANTEPARTUM(COMPREHENSIVE) NOTE  Jenny HarrierReteka C Fox is a 29 y.o. [id number] at 9246w3d by best clinical estimate who is admitted for rupture of membranes.   Fetal presentation is cephalic. Fetal absence of arms noted on u/s  Cardiac echo normal Length of Stay:  5  Days  Subjective: Pt denies any complaints. She reports persistent leakage of fluid Patient reports the fetal movement as active. Patient reports uterine contraction  activity as none. Patient reports  vaginal bleeding as none. Patient describes fluid per vagina as Clear.  Vitals:  Blood pressure 116/61, pulse 87, temperature 98.5 F (36.9 C), temperature source Oral, resp. rate 20, height 5\' 8"  (1.727 m), weight 200 lb 11.2 oz (91.037 kg), last menstrual period 12/03/2013, unknown if currently breastfeeding. Physical Examination:  General appearance - alert, well appearing, and in no distress Heart - normal rate and regular rhythm Abdomen - soft, nontender, gravid Fundal Height:  size equals dates Cervical Exam: Not evaluated.  Extremities: extremities normal, atraumatic, no cyanosis or edema and Homans sign is negative, no sign of DVT with DTRs 2+ bilaterally Membranes:intact, ruptured  Fetal Monitoring:  Baseline: 130 bpm, Variability: Good {> 6 bpm), Accelerations: Reactive, Decelerations: Absent and Toco: no contractions  Labs:  Results for orders placed or performed during the hospital encounter of 07/14/14 (from the past 24 hour(s))  Type and screen   Collection Time: 07/18/14  8:20 PM  Result Value Ref Range   ABO/RH(D) O POS    Antibody Screen NEG    Sample Expiration 07/21/2014     Imaging Studies:     Currently EPIC will not allow sonographic studies to automatically populate into notes.  In the meantime, copy and paste results into note or free text.  Medications:  Scheduled . amoxicillin  500 mg Oral Q8H  .  docusate sodium  100 mg Oral Daily  . erythromycin  250 mg Oral Q6H  . metroNIDAZOLE  500 mg Oral Q12H  . prenatal multivitamin  1 tablet Oral Q1200   I have reviewed the patient's current medications.  ASSESSMENT: Patient Active Problem List   Diagnosis Date Noted  . Abnormal fetal ultrasound   . Preterm premature rupture of membranes (PPROM) with unknown onset of labor 07/14/2014  . Trichomonal vaginitis during pregnancy 07/14/2014  . Cocaine abuse affecting pregnancy in third trimester 07/14/2014  . History of VBAC x 2 09/20/2011  . Previous preterm delivery, antepartum 08/05/2011  . Previous cesarean delivery, antepartum condition or complication 08/05/2011  . Tobacco smoking complicating pregnancy in third trimester 08/05/2011    PLAN: 1) PPROM - Continue latency antibiotics -Continue monitoring for signs/symptoms of chorioamnionitis - Continue current antepartum care  2) Previous cesarean section - Will need to confirm fetal position  - Patient desires TOLAC  3) Tobacco smoking in pregnancy - Patient declines nicotine patch  4) Preterm labor - patient with advance cervical dilation - no contractions at this time - preterm labor precautions reviewed  5) trichomonas in pregnancy - Continue Flagyl for a total of 7 days  6) Fetal anomalies on ultrasound - Will need follow up ultrasound next week  [name] 07/19/2014,6:56 AM

## 2014-07-20 ENCOUNTER — Inpatient Hospital Stay (HOSPITAL_COMMUNITY): Payer: Medicaid Other | Admitting: Anesthesiology

## 2014-07-20 ENCOUNTER — Encounter (HOSPITAL_COMMUNITY): Payer: Self-pay | Admitting: *Deleted

## 2014-07-20 DIAGNOSIS — O42919 Preterm premature rupture of membranes, unspecified as to length of time between rupture and onset of labor, unspecified trimester: Secondary | ICD-10-CM

## 2014-07-20 DIAGNOSIS — O9832 Other infections with a predominantly sexual mode of transmission complicating childbirth: Secondary | ICD-10-CM

## 2014-07-20 DIAGNOSIS — A5901 Trichomonal vulvovaginitis: Secondary | ICD-10-CM

## 2014-07-20 DIAGNOSIS — O09213 Supervision of pregnancy with history of pre-term labor, third trimester: Secondary | ICD-10-CM

## 2014-07-20 DIAGNOSIS — O3421 Maternal care for scar from previous cesarean delivery: Secondary | ICD-10-CM

## 2014-07-20 DIAGNOSIS — O289 Unspecified abnormal findings on antenatal screening of mother: Secondary | ICD-10-CM

## 2014-07-20 DIAGNOSIS — O99324 Drug use complicating childbirth: Secondary | ICD-10-CM

## 2014-07-20 DIAGNOSIS — Z3A31 31 weeks gestation of pregnancy: Secondary | ICD-10-CM

## 2014-07-20 DIAGNOSIS — O42913 Preterm premature rupture of membranes, unspecified as to length of time between rupture and onset of labor, third trimester: Secondary | ICD-10-CM

## 2014-07-20 DIAGNOSIS — O23593 Infection of other part of genital tract in pregnancy, third trimester: Secondary | ICD-10-CM

## 2014-07-20 MED ORDER — ONDANSETRON HCL 4 MG/2ML IJ SOLN: 4.0000 mg | Freq: Four times a day (QID) | INTRAMUSCULAR | Status: DC | PRN

## 2014-07-20 MED ORDER — MAGNESIUM SULFATE 4 GM/100ML IV SOLN
4.0000 g | Freq: Once | INTRAVENOUS | Status: DC
  Filled 2014-07-20: qty 100

## 2014-07-20 MED ORDER — METHYLERGONOVINE MALEATE 0.2 MG PO TABS: 0.2000 mg | ORAL_TABLET | ORAL | Status: DC | PRN

## 2014-07-20 MED ORDER — ACETAMINOPHEN 325 MG PO TABS: 650.0000 mg | ORAL_TABLET | ORAL | Status: DC | PRN

## 2014-07-20 MED ORDER — CITRIC ACID-SODIUM CITRATE 334-500 MG/5ML PO SOLN
30.0000 mL | ORAL | Status: DC | PRN
Start: 2014-07-20 — End: 2014-07-20

## 2014-07-20 MED ORDER — FENTANYL 2.5 MCG/ML BUPIVACAINE 1/10 % EPIDURAL INFUSION (WH - ANES)
14.0000 mL/h | INTRAMUSCULAR | Status: DC | PRN
  Administered 2014-07-20: 12 mL/h via EPIDURAL

## 2014-07-20 MED ORDER — BENZOCAINE-MENTHOL 20-0.5 % EX AERO: 1.0000 "application " | INHALATION_SPRAY | CUTANEOUS | Status: DC | PRN

## 2014-07-20 MED ORDER — SENNOSIDES-DOCUSATE SODIUM 8.6-50 MG PO TABS
2.0000 | ORAL_TABLET | ORAL | Status: DC
  Administered 2014-07-20 – 2014-07-21 (×2): 2 via ORAL
  Filled 2014-07-20 (×2): qty 2

## 2014-07-20 MED ORDER — WITCH HAZEL-GLYCERIN EX PADS: 1.0000 "application " | MEDICATED_PAD | CUTANEOUS | Status: DC | PRN

## 2014-07-20 MED ORDER — METHYLERGONOVINE MALEATE 0.2 MG/ML IJ SOLN
INTRAMUSCULAR | Status: AC
  Administered 2014-07-20: 0.2 mg via INTRAMUSCULAR
  Filled 2014-07-20: qty 1

## 2014-07-20 MED ORDER — LIDOCAINE HCL (PF) 1 % IJ SOLN
INTRAMUSCULAR | Status: AC
  Filled 2014-07-20: qty 30

## 2014-07-20 MED ORDER — EPHEDRINE 5 MG/ML INJ
10.0000 mg | INTRAVENOUS | Status: DC | PRN
  Filled 2014-07-20: qty 2

## 2014-07-20 MED ORDER — MAGNESIUM SULFATE 50 % IJ SOLN
2.0000 g/h | INTRAVENOUS | Status: DC
  Filled 2014-07-20: qty 80

## 2014-07-20 MED ORDER — ONDANSETRON HCL 4 MG PO TABS: 4.0000 mg | ORAL_TABLET | ORAL | Status: DC | PRN

## 2014-07-20 MED ORDER — ZOLPIDEM TARTRATE 5 MG PO TABS: 5.0000 mg | ORAL_TABLET | Freq: Every evening | ORAL | Status: DC | PRN

## 2014-07-20 MED ORDER — OXYCODONE-ACETAMINOPHEN 5-325 MG PO TABS: 2.0000 | ORAL_TABLET | ORAL | Status: DC | PRN

## 2014-07-20 MED ORDER — SIMETHICONE 80 MG PO CHEW: 80.0000 mg | CHEWABLE_TABLET | ORAL | Status: DC | PRN

## 2014-07-20 MED ORDER — BUPIVACAINE HCL (PF) 0.25 % IJ SOLN
INTRAMUSCULAR | Status: DC | PRN
  Administered 2014-07-20 (×2): 4 mL

## 2014-07-20 MED ORDER — FENTANYL 2.5 MCG/ML BUPIVACAINE 1/10 % EPIDURAL INFUSION (WH - ANES)
INTRAMUSCULAR | Status: AC
  Filled 2014-07-20: qty 125

## 2014-07-20 MED ORDER — LANOLIN HYDROUS EX OINT: TOPICAL_OINTMENT | CUTANEOUS | Status: DC | PRN

## 2014-07-20 MED ORDER — DIBUCAINE 1 % RE OINT: 1.0000 "application " | TOPICAL_OINTMENT | RECTAL | Status: DC | PRN

## 2014-07-20 MED ORDER — FENTANYL CITRATE (PF) 100 MCG/2ML IJ SOLN
50.0000 ug | INTRAMUSCULAR | Status: DC | PRN
  Filled 2014-07-20: qty 2

## 2014-07-20 MED ORDER — PRENATAL MULTIVITAMIN CH
1.0000 | ORAL_TABLET | Freq: Every day | ORAL | Status: DC
  Administered 2014-07-21 – 2014-07-22 (×2): 1 via ORAL
  Filled 2014-07-20 (×2): qty 1

## 2014-07-20 MED ORDER — OXYTOCIN BOLUS FROM INFUSION: 500.0000 mL | INTRAVENOUS | Status: DC

## 2014-07-20 MED ORDER — OXYTOCIN 40 UNITS IN LACTATED RINGERS INFUSION - SIMPLE MED
62.5000 mL/h | INTRAVENOUS | Status: DC
  Administered 2014-07-20: 62.5 mL/h via INTRAVENOUS

## 2014-07-20 MED ORDER — DIPHENHYDRAMINE HCL 50 MG/ML IJ SOLN: 12.5000 mg | INTRAMUSCULAR | Status: DC | PRN

## 2014-07-20 MED ORDER — DIPHENHYDRAMINE HCL 25 MG PO CAPS: 25.0000 mg | ORAL_CAPSULE | Freq: Four times a day (QID) | ORAL | Status: DC | PRN

## 2014-07-20 MED ORDER — IBUPROFEN 600 MG PO TABS
600.0000 mg | ORAL_TABLET | Freq: Four times a day (QID) | ORAL | Status: DC
  Administered 2014-07-20 – 2014-07-22 (×8): 600 mg via ORAL
  Filled 2014-07-20 (×8): qty 1

## 2014-07-20 MED ORDER — ONDANSETRON HCL 4 MG/2ML IJ SOLN: 4.0000 mg | INTRAMUSCULAR | Status: DC | PRN

## 2014-07-20 MED ORDER — LACTATED RINGERS IV BOLUS (SEPSIS)
500.0000 mL | Freq: Once | INTRAVENOUS | Status: AC
  Administered 2014-07-20: 500 mL via INTRAVENOUS

## 2014-07-20 MED ORDER — PHENYLEPHRINE 40 MCG/ML (10ML) SYRINGE FOR IV PUSH (FOR BLOOD PRESSURE SUPPORT)
PREFILLED_SYRINGE | INTRAVENOUS | Status: AC
  Filled 2014-07-20: qty 20

## 2014-07-20 MED ORDER — OXYCODONE-ACETAMINOPHEN 5-325 MG PO TABS: 1.0000 | ORAL_TABLET | ORAL | Status: DC | PRN

## 2014-07-20 MED ORDER — LACTATED RINGERS IV SOLN
INTRAVENOUS | Status: DC
Start: 2014-07-20 — End: 2014-07-20

## 2014-07-20 MED ORDER — PHENYLEPHRINE 40 MCG/ML (10ML) SYRINGE FOR IV PUSH (FOR BLOOD PRESSURE SUPPORT)
80.0000 ug | PREFILLED_SYRINGE | INTRAVENOUS | Status: DC | PRN
  Filled 2014-07-20: qty 2

## 2014-07-20 MED ORDER — MAGNESIUM SULFATE BOLUS VIA INFUSION
4.0000 g | Freq: Once | INTRAVENOUS | Status: DC
  Filled 2014-07-20: qty 500

## 2014-07-20 MED ORDER — METHYLERGONOVINE MALEATE 0.2 MG/ML IJ SOLN: 0.2000 mg | INTRAMUSCULAR | Status: DC | PRN

## 2014-07-20 MED ORDER — TETANUS-DIPHTH-ACELL PERTUSSIS 5-2.5-18.5 LF-MCG/0.5 IM SUSP
0.5000 mL | Freq: Once | INTRAMUSCULAR | Status: AC
  Administered 2014-07-21: 0.5 mL via INTRAMUSCULAR

## 2014-07-20 MED ORDER — MORPHINE SULFATE 4 MG/ML IJ SOLN
2.0000 mg | INTRAMUSCULAR | Status: AC
  Administered 2014-07-20: 2 mg via INTRAVENOUS
  Filled 2014-07-20: qty 1

## 2014-07-20 MED ORDER — LACTATED RINGERS IV SOLN: 500.0000 mL | INTRAVENOUS | Status: DC | PRN

## 2014-07-20 MED ORDER — OXYTOCIN 40 UNITS IN LACTATED RINGERS INFUSION - SIMPLE MED
INTRAVENOUS | Status: AC
  Administered 2014-07-20: 62.5 mL/h via INTRAVENOUS
  Filled 2014-07-20: qty 1000

## 2014-07-20 MED ORDER — FENTANYL CITRATE (PF) 100 MCG/2ML IJ SOLN
100.0000 ug | Freq: Once | INTRAMUSCULAR | Status: AC
  Administered 2014-07-20: 100 ug via INTRAVENOUS

## 2014-07-20 MED ORDER — LIDOCAINE HCL (PF) 1 % IJ SOLN
30.0000 mL | INTRAMUSCULAR | Status: DC | PRN
  Filled 2014-07-20: qty 30

## 2014-07-20 MED ORDER — LIDOCAINE-EPINEPHRINE (PF) 2 %-1:200000 IJ SOLN
INTRAMUSCULAR | Status: DC | PRN
  Administered 2014-07-20: 5 mL

## 2014-07-20 NOTE — Consult Note (Signed)
[facility]:  [facility]inal Delivery       07/20/2014  1:10 PM  I was called to the birthing suite at the request of the patient's obstetrician (Dr. Adrian BlackwaterStinson) for SVD at 31 and 4/7 weeks  PRENATAL HX:  [id number], pregnancy complicated by PROM on 07/14/14, no prenatal care, and ultrasound evidence of absence of the left arrm, and possibly loss of right arm. A fetal echocardiogram was normal.  History of cocaine use (positive on 7/10 drug screen) and positive trichomonas.  She was admitted to antenatal unit, and was receiving treatment that includes betamethasone (7/10, 7/11), ampicillin/amoxicillin, erythromycin, metronidazole.  She began contracting regularly this morning and progressed quickly to delivery.    DELIVERY:  Infant was vigorous at delivery, requiring no resuscitation other than standard warming, drying and stimulation.  Cord clamping was delayed for one minute.  APGARs 8 and 9.  Exam notable for absence of left arm, small 1-2 cm pedunculated soft tissue appendage at left shoulder.  Right arm notable for shortened humerus and shorted forearm.  Decreased ROM at shoulder, elbow and wrist.  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  4 and 5 are separate and appropriate length, though 4 is contraced.  Left ear is flattened, though this may be positional.  Tested low in inguinal canal.  No other abnormalities noted on exam.  Will admit to NICU for prematurity.    _____________________ Electronically Signed By: Maryan CharLindsey Geoge Lawrance, MD Neonatologist

## 2014-07-20 NOTE — Anesthesia Procedure Notes (Signed)
Epidural Patient location during procedure: OB  Staffing Anesthesiologist: Nekhi Liwanag, CHRIS Performed by: anesthesiologist   Preanesthetic Checklist Completed: patient identified, surgical consent, pre-op evaluation, timeout performed, IV checked, risks and benefits discussed and monitors and equipment checked  Epidural Patient position: sitting Prep: site prepped and draped and DuraPrep Patient monitoring: heart rate, cardiac monitor, continuous pulse ox and blood pressure Approach: midline Location: L3-L4 Injection technique: LOR saline  Needle:  Needle type: Tuohy  Needle gauge: 17 G Needle length: 9 cm Needle insertion depth: 7 cm Catheter type: closed end flexible Catheter size: 19 Gauge Catheter at skin depth: 12 cm Test dose: negative and 1.5% lidocaine with Epi 1:200 K  Assessment Events: blood not aspirated, injection not painful, no injection resistance, negative IV test and no paresthesia  Additional Notes H+P and labs checked, risks and benefits discussed with the patient, consent obtained, procedure tolerated well and without complications.  Reason for block:procedure for pain

## 2014-07-20 NOTE — Progress Notes (Signed)
Patient ID: [name], female   DOB: 03/29/1985, 29 y.o.   MRN: [id number] FACULTY PRACTICE ANTEPARTUM(COMPREHENSIVE) NOTE  [name] is a 29 y.o. [id number] at 914w3d by best clinical estimate who is admitted for rupture of membranes.   Fetal presentation is cephalic. Fetal absence of arms noted on u/s  Cardiac echo normal Length of Stay:  6  Days  Subjective: Pt denies any complaints. She reports mild persistent leakage of fluid Patient reports the fetal movement as active. Patient reports uterine contraction  activity as none. Patient reports  vaginal bleeding as none. Patient describes fluid per vagina as Clear.  Vitals:  Blood pressure 104/61, pulse 78, temperature 98.6 F (37 C), temperature source Oral, resp. rate 20, height 5\' 8"  (1.727 m), weight 200 lb 11.2 oz (91.037 kg), last menstrual period 12/03/2013, unknown if currently breastfeeding. Physical Examination:  General appearance - alert, well appearing, and in no distress Heart - normal rate and regular rhythm Lungs - CTA.  Good respiratory effort. Abdomen - soft, nontender, gravid Fundal Height:  size equals dates Cervical Exam: Not evaluated.  Extremities: extremities normal, atraumatic, no cyanosis or edema and Homans sign is negative, no sign of DVT  Membranes: ruptured  Fetal Monitoring:  Baseline: 130 bpm, Variability: Good {> 6 bpm), Accelerations: Reactive, Decelerations: Absent and Toco: no contractions  Labs:  No results found for this or any previous visit (from the past 24 hour(s)).  Imaging Studies:     Currently EPIC will not allow sonographic studies to automatically populate into notes.  In the meantime, copy and paste results into note or free text.  Medications:  Scheduled . amoxicillin  500 mg Oral Q8H  . docusate sodium  100 mg Oral Daily  . erythromycin  250 mg Oral Q6H  . metroNIDAZOLE  500 mg Oral Q12H  . prenatal multivitamin  1 tablet Oral Q1200  . sodium chloride  3 mL  Intravenous Q12H   I have reviewed the patient's current medications.  ASSESSMENT: Patient Active Problem List   Diagnosis Date Noted  . Abnormal fetal ultrasound   . Preterm premature rupture of membranes (PPROM) with unknown onset of labor 07/14/2014  . Trichomonal vaginitis during pregnancy 07/14/2014  . Cocaine abuse affecting pregnancy in third trimester 07/14/2014  . History of VBAC x 2 09/20/2011  . Previous preterm delivery, antepartum 08/05/2011  . Previous cesarean delivery, antepartum condition or complication 08/05/2011  . Tobacco smoking complicating pregnancy in third trimester 08/05/2011    PLAN: 1) PPROM - Continue latency antibiotics -Continue monitoring for signs/symptoms of chorioamnionitis - Continue current antepartum care  2) Preterm labor - patient with advance cervical dilation - no contractions at this time - preterm labor precautions reviewed  3) trichomonas in pregnancy - Continue Flagyl for a total of 7 days - tolerating medications at this time.  4) Fetal anomalies on ultrasound - Ultrasound ordered for next week  STINSON, JACOB JEHIEL 07/20/2014,7:27 AM

## 2014-07-20 NOTE — Progress Notes (Signed)
Patient reports feeling contractions.  I will place her on the monitor for evaluation.

## 2014-07-20 NOTE — Anesthesia Preprocedure Evaluation (Signed)
Anesthesia Evaluation  Patient identified by MRN, date of birth, ID band Patient awake    Reviewed: Allergy & Precautions, Patient's Chart, lab work & pertinent test results  History of Anesthesia Complications Negative for: history of anesthetic complications  Airway Mallampati: II  TM Distance: >3 FB Neck ROM: Full    Dental  (+) Teeth Intact   Pulmonary Current Smoker,  breath sounds clear to auscultation        Cardiovascular negative cardio ROS  Rhythm:Regular     Neuro/Psych negative neurological ROS  negative psych ROS   GI/Hepatic negative GI ROS, Neg liver ROS,   Endo/Other  negative endocrine ROS  Renal/GU negative Renal ROS     Musculoskeletal   Abdominal   Peds  Hematology negative hematology ROS (+)   Anesthesia Other Findings   Reproductive/Obstetrics (+) Pregnancy                             Anesthesia Physical Anesthesia Plan  ASA: II  Anesthesia Plan: Epidural   Post-op Pain Management:    Induction:   Airway Management Planned:   Additional Equipment:   Intra-op Plan:   Post-operative Plan:   Informed Consent: I have reviewed the patients History and Physical, chart, labs and discussed the procedure including the risks, benefits and alternatives for the proposed anesthesia with the patient or authorized representative who has indicated his/her understanding and acceptance.   Dental advisory given  Plan Discussed with: Anesthesiologist  Anesthesia Plan Comments:         Anesthesia Quick Evaluation

## 2014-07-21 NOTE — Clinical Social Work Maternal (Signed)
CLINICAL SOCIAL WORK MATERNAL/CHILD NOTE  Patient Details  Name: [name] MRN: [id number] Date of Birth: 09-Mar-1985  Date:  07/21/2014  Clinical Social Worker Initiating Note:  Norlene Duel, LCSW Date/ Time Initiated:  07/21/14/1400     Child's Name:  [name]   Legal Guardian:   (Parents [name] and [name])   Need for Interpreter:  None   Date of Referral:   (7/16/116)     Reason for Referral:  Other (Comment)   Referral Source:  NICU   Address:  [place], [place] [address]  Phone number:   [phone])   Household Members:  Relatives   Natural Supports (not living in the home):  Extended Family, Immediate Family, Spouse/significant other   Professional Supports: None   Employment:  (Mother is employed and FOB is incarcerated)   Type of Work:  (Mother works at [employer])   Education:      Pensions consultant:   (medicaid pending)   Other Resources:      Cultural/Religious Considerations Which May Impact Care:  None noted  Strengths:  Ability to meet basic needs , Home prepared for child    Risk Factors/Current Problems:  Substance Use    Cognitive State:  Alert , Able to Concentrate    Mood/Affect:  Calm , Interested , Tearful    CSW Assessment:  Acknowledged order for social work consult to assess mother's hx of substance abuse.  MOB also had limited PNC.  Newborn was admitted to NICU with dx of oligohydramnios and upper extremity abnormalities.   Met with mother who was pleasant and receptive to CSW.    She has three other children, but none are in her care.  Informed that all three of her children ages [address] age11, [place] age 55, and Warrick Parisian age 67 are being cared for by their fathers.   Mother states that she considered terminating the pregnancy, but decided against it and is happy about her decision.  FOB is currently incarcerated and MOB states that he should be released soon.  Informed  that he is very supportive of her and newborn and encouraged her to follow through with the pregnancy.  Mother admits to hx of cocaine (snorthing) on and off since age 36.  Informed that she was clean for a while but relapsed about a week before she was admitted.  She denies any only illicit drug use.   MOB repots no treatment hx, but states that she meet with a social worker last week and discussed treatment options.  She communicate a willingness to participate in a treatment program.   UDS on newborn was negative, and UDS on mother was positive at time of admission.   She denies any hx of mental illness.    Mother states that she is well prepared at home for newborn.  Mother states that she has visited newborn in NICU and spoken with staff about his condition.  Informed that he was diagnosed during pregnancy and she has had time to process the information.  Mother states that initially she was in shock and angry, but feels she has been coping reasonably well.  She became tearful when informed of referral that will be made to DSS.  And, spoke of her desire to parent this child. Provided supportive feedback and allow her to vent.  She is aware that newborn will required a lengthy hospitalization and she reports no transportation issues.    Mother informed  of social work Fish farm manager.  CSW Plan/Description:     Referral made to DSS.  Spoke with [name] and informed that case will be staffed with the supervisor.   Mother given a list of mental health and substance abuse providers.                                                                                                Will continue to monitor drug screen. Encouraged mother to apply for [facility] and food stamps CSW will follow up with DSS and continue to follow for support    Jenny Tristan J, LCSW 07/21/2014, 4:24 PM

## 2014-07-21 NOTE — Progress Notes (Signed)
Post Partum Day 1 Subjective: no complaints, up ad lib, voiding and tolerating PO  Objective: Blood pressure 101/61, pulse 76, temperature 97.8 F (36.6 C), temperature source Oral, resp. rate 18, height 5\' 8"  (1.727 m), weight 200 lb (90.719 kg), last menstrual period 12/03/2013, SpO2 99 %, unknown if currently breastfeeding.  Physical Exam:  General: alert and no distress Lochia: appropriate Uterine Fundus: firm DVT Evaluation: No evidence of DVT seen on physical exam.  No results for input(s): HGB, HCT in the last 72 hours.  Assessment/Plan: Plan for discharge tomorrow, Social Work consult and Contraception desire BTL.  WiIll get Depo Provera prior to discharge.   LOS: 7 days   HARRAWAY-SMITH, Zane Samson 07/21/2014, 9:14 AM

## 2014-07-22 MED ORDER — MEDROXYPROGESTERONE ACETATE 150 MG/ML IM SUSP
150.0000 mg | Freq: Once | INTRAMUSCULAR | Status: AC
  Administered 2014-07-22: 150 mg via INTRAMUSCULAR
  Filled 2014-07-22: qty 1

## 2014-07-22 MED ORDER — IBUPROFEN 600 MG PO TABS: 600.0000 mg | ORAL_TABLET | Freq: Four times a day (QID) | ORAL | Status: AC | PRN

## 2014-07-22 NOTE — Anesthesia Postprocedure Evaluation (Signed)
  Anesthesia Post-op Note  Patient: [name]  Procedure(s) Performed: * No procedures listed *  Patient Location: PACU and Mother/Baby  Anesthesia Type:Epidural  Level of Consciousness: awake, alert  and oriented  Airway and Oxygen Therapy: Patient Spontanous Breathing  Post-op Pain: none  Post-op Assessment: Post-op Vital signs reviewed, Patient's Cardiovascular Status Stable, No headache, No backache, No residual numbness and No residual motor weakness  Post-op Vital Signs: Reviewed and stable  Complications: No apparent anesthesia complications

## 2014-07-22 NOTE — Discharge Instructions (Signed)

## 2014-07-22 NOTE — Progress Notes (Signed)
[facility] [facility] stating she is on her way to meet with MOB.

## 2014-07-22 NOTE — Accreditation Note (Signed)
Discharge teaching complete. Pt understood all information and did not have any questions. Pt ambulated out of the hospital and discharged home to family.  

## 2014-07-22 NOTE — Progress Notes (Signed)
CSW met with MOB at baby's bedside to check in now that she has delivered.  MOB reports feeling well.  She states meeting with CPS worker was fine.  Since MOB will be having a substance abuse assessment through [facility], CSW suggests waiting on that counselor to make a referral for substance abuse counseling rather than CSW making a referral to an agency that may then be changed based on DHHS counselor's assessment.  MOB is in agreement with this plan.  CSW reviewed support services offered by NICU CSW and asked MOB to call any time.  She thanked CSW and seemed appreciative.  CSW asked if she will have any issues with transportation to visit baby after her discharge today.  MOB states she drives and will not have any issues.

## 2014-07-22 NOTE — Discharge Summary (Signed)
Obstetric Discharge Summary Reason for Admission: PPROM at 2859w6d, no prenatal care, cocaine use Prenatal Procedures: NST and ultrasounds Intrapartum Procedures: spontaneous vaginal delivery at 3986w4d Postpartum Procedures: none Complications-Operative and Postpartum: none  Hemoglobin/Hemoglobin:  Lab Results  Component Value Date/Time   HGB 11.8* 07/14/2014 09:10 AM   HCT 33.8* 07/14/2014 09:10 AM    Brief Hospital Course: 29 y.o. is a [id number] who was admitted 07/14/2014 with preterm premature rupture of membranes (PPROM) at 7059w6d.  She had no prenatal care, and positive cocaine on admission UDS.  Also positive for trichomonas on admission. She was admitted to antenatal unit and started on betamethasone (2 doses, 24 hours apart), latency antibiotics, metronidazole for trichomonass and magnesium sulfate for tocolysis and fetal neuroprotection.  She had no signs or symptoms of toxicity, and the magnesium sulfate was discontinued after 24 hours.  She had ultrasounds by MFM that showed absence of fetal left arm and right arm deformities, possible heart anomalies but follow up fetal echocardiogram was normal. She was followed by SW, also had neonatology consultation.  She had no signs or symptoms of progressing preterm labor,  daily NSTs which remained reassuring, and initially had no other maternal-fetal concerns.  However, on 07/20/14, she started to have preterm contractions and was noted to have progression of preterm labor into active labor.   She was transferred to L&D where she had an uncomplicated SVD of female infant.  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 Light meconium stained fluids were observed prior to delivery.  APGAR: 8, 9; weight pending  .   Placenta status: intact with trailing membranes. Membranes were manually extracted from uterus. Placenta sent to  pathology. Methergine protocol initiated. Delayed cord clamping for one minute. Cord: 3 vessels with the following complications: none.  Cord pH: N/A Anesthesia: Epidural  Episiotomy: None Lacerations: None Suture Repair: none Est. Blood Loss (mL):  350 Mom to postpartum.  Baby to NICU. De HollingsheadCatherine L Wallace 07/20/2014, 2:01 PM  As per Neonatology notes: Infant was vigorous at delivery, requiring no resuscitation other than standard warming, drying and stimulation. Cord clamping was delayed for one minute. APGARs 8 and 9. Exam notable for absence of left arm, small 1-2 cm pedunculated soft tissue appendage at left shoulder. Right arm notable for shortened humerus and shorted forearm. Decreased ROM at shoulder, elbow and wrist. 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 4 and 5 are separate and appropriate length, though 4 is contraced. Left ear is flattened, though this may be positional. Tested low in inguinal canal. No other abnormalities noted on exam. Will admit to NICU for prematurity.   Patient had an uncomplicated postpartum course, SW was involved in postpartum course.  By time of discharge on PPD#2, her pain was controlled on oral pain medications; she had appropriate lochia and was ambulating, voiding without difficulty, tolerating regular diet and passing flatus.  She is bottlefeeding and will use Depo Provera for postpartum contraception which was given prior to discharge.  She was deemed stable for discharge to home.    Discharge Exam: Blood pressure 107/63, pulse 75, temperature 98.3 F (36.8 C), temperature source Oral, resp. rate 18, height 5\' 8"  (1.727 m), weight 200 lb (90.719 kg), last menstrual period 12/03/2013, SpO2 100 %, unknown if currently breastfeeding. General appearance: Alert and no distress Resp: Clear to auscultation bilaterally Cardio: Regular rate and rhythm Abdomen: Fundus below umbilicus, appropriate  lochia. NT abdomen. Extremities:  Extremities normal, atraumatic, no cyanosis or edema and Homans sign is negative, no sign of DVT Pulses: 2+ and symmetric Neurologic: Alert and oriented X 3, normal strength and tone. Normal symmetric reflexes. Normal coordination and gait  Discharge Diagnoses: Premature labor and delivery  Discharge Information: Date: 07/22/2014 Discharge Instructions: Per After Visit Summary. Activity: Advance as tolerated. Pelvic rest for 6 weeks.  Also refer to After Visit Summary Diet: Regular Contraception: Depo-Provera given on 07/22/2014 Medications: Ibuprofen Condition: stable Discharge to: home   Jaynie Collins, MD, FACOG Attending Obstetrician & Gynecologist Faculty Practice, [place]

## 2014-07-22 NOTE — Progress Notes (Signed)
CPS worker met with MOB and will staff the case with her supervisor and keep CSW up to date on plan for baby's safe disposition.   

## 2014-08-19 ENCOUNTER — Ambulatory Visit: Payer: Medicaid Other | Admitting: Family Medicine

## 2014-12-02 ENCOUNTER — Emergency Department (INDEPENDENT_AMBULATORY_CARE_PROVIDER_SITE_OTHER)
Admission: EM | Admit: 2014-12-02 | Discharge: 2014-12-02 | Disposition: A | Discharge status: Home / Self Care | Payer: Medicaid Other | Source: Home / Self Care | Attending: Family Medicine | Admitting: Family Medicine

## 2014-12-02 ENCOUNTER — Encounter (HOSPITAL_COMMUNITY): Payer: Self-pay | Admitting: Emergency Medicine

## 2014-12-02 DIAGNOSIS — J069 Acute upper respiratory infection, unspecified: Secondary | ICD-10-CM | POA: Diagnosis not present

## 2014-12-02 LAB — POCT RAPID STREP A: Streptococcus, Group A Screen (Direct): NEGATIVE

## 2014-12-02 MED ORDER — IPRATROPIUM BROMIDE 0.06 % NA SOLN
2.0000 | Freq: Four times a day (QID) | NASAL | Status: AC
Start: 2014-12-02 — End: ?

## 2014-12-02 MED ORDER — GUAIFENESIN-CODEINE 100-10 MG/5ML PO SYRP: 10.0000 mL | ORAL_SOLUTION | Freq: Four times a day (QID) | ORAL | Status: AC | PRN

## 2014-12-02 NOTE — Discharge Instructions (Signed)
Drink plenty of fluids as discussed, use medicine as prescribed, and mucinex or delsym for cough. Return or see your doctor if further problems °

## 2014-12-02 NOTE — ED Provider Notes (Addendum)
CSN: [id number]     Arrival date & time 12/02/14  1428 History   First MD Initiated Contact with Patient 12/02/14 1554     Chief Complaint  Patient presents with  . Sore Throat   (Consider location/radiation/quality/duration/timing/severity/associated sxs/prior Treatment) Patient is a 29 y.o. female presenting with pharyngitis. The history is provided by the patient.  Sore Throat This is a new problem. The current episode started 2 days ago. The problem has not changed since onset.Pertinent negatives include no chest pain and no abdominal pain. The symptoms are aggravated by swallowing.    Past Medical History  Diagnosis Date  . Urinary tract infection   . Preterm labor   . Trichomonas    Past Surgical History  Procedure Laterality Date  . Cesarean section    . Induced abortion     Family History  Problem Relation Age of Onset  . Other Neg Hx    Social History  Substance Use Topics  . Smoking status: Current Every Day Smoker -- 0.50 packs/day for 7 years    Types: Cigarettes  . Smokeless tobacco: Never Used  . Alcohol Use: No   OB History    Gravida Para Term Preterm AB TAB SAB Ectopic Multiple Living   5 4 2 2 1 1  0 0 0 4     Review of Systems  Constitutional: Negative.  Negative for fever.  HENT: Positive for congestion and postnasal drip.   Respiratory: Negative.   Cardiovascular: Negative.  Negative for chest pain.  Gastrointestinal: Negative.  Negative for abdominal pain.  Genitourinary: Negative.   All other systems reviewed and are negative.   Allergies  Review of patient's allergies indicates no known allergies.  Home Medications   Prior to Admission medications   Medication Sig Start Date End Date Taking? Authorizing Provider  guaiFENesin-codeine (ROBITUSSIN AC) 100-10 MG/5ML syrup Take 10 mLs by mouth 4 (four) times daily as needed for cough. 12/02/14   Linna HoffJames D Nilton Lave, MD  ibuprofen (ADVIL,MOTRIN) 600 MG tablet Take 1 tablet (600 mg total) by mouth  every 6 (six) hours as needed. 07/22/14   Tereso NewcomerUgonna A Anyanwu, MD  ipratropium (ATROVENT) 0.06 % nasal spray Place 2 sprays into both nostrils 4 (four) times daily. 12/02/14   Linna HoffJames D Taedyn Glasscock, MD  Prenatal Vit-Fe Fumarate-FA (PRENATAL COMPLETE) 14-0.4 MG TABS Take 1 tablet by mouth daily. Patient not taking: Reported on 07/14/2014 03/02/14   Trixie DredgeEmily West, PA-C   Meds Ordered and Administered this Visit  Medications - No data to display  BP 125/85 mmHg  Pulse 89  Temp(Src) 98.5 F (36.9 C) (Oral)  Resp 16  SpO2 100%  LMP 12/02/2014 No data found.   Physical Exam  Constitutional: She is oriented to person, place, and time. She appears well-developed and well-nourished. No distress.  HENT:  Right Ear: External ear normal.  Left Ear: External ear normal.  Mouth/Throat: Oropharynx is clear and moist.  Eyes: Conjunctivae are normal. Pupils are equal, round, and reactive to light.  Neck: Normal range of motion. Neck supple.  Cardiovascular: Normal rate.   Pulmonary/Chest: Effort normal and breath sounds normal.  Lymphadenopathy:    She has no cervical adenopathy.  Neurological: She is alert and oriented to person, place, and time.  Skin: Skin is warm and dry.  Nursing note and vitals reviewed.   ED Course  Procedures (including critical care time)  Labs Review Labs Reviewed  POCT RAPID STREP A   Strep neg  Imaging Review No results found.  Visual Acuity Review  Right Eye Distance:   Left Eye Distance:   Bilateral Distance:    Right Eye Near:   Left Eye Near:    Bilateral Near:         MDM   1. URI (upper respiratory infection)        Linna Hoff, MD 12/02/14 1640  Linna Hoff, MD 12/02/14 1640

## 2014-12-02 NOTE — ED Notes (Signed)
C/o sore throat, bilateral ear pain and head hurting that started yesterday, symptoms worsened today per patient

## 2014-12-04 LAB — CULTURE, GROUP A STREP: STREP A CULTURE: NEGATIVE

## 2014-12-05 NOTE — ED Notes (Signed)
Final report of strep testing negative  

## 2015-03-25 ENCOUNTER — Encounter: Payer: Self-pay | Admitting: Pediatrics
